# Patient Record
Sex: Male | Born: 1955 | Hispanic: Yes | Marital: Married | Smoking: Never smoker
Health system: Southern US, Community
[De-identification: ages and names within clinical notes are randomized; demographics above are authoritative.]

## PROBLEM LIST (undated history)

## (undated) DIAGNOSIS — M549 Dorsalgia, unspecified: Secondary | ICD-10-CM

## (undated) DIAGNOSIS — R109 Unspecified abdominal pain: Secondary | ICD-10-CM

## (undated) DIAGNOSIS — N399 Disorder of urinary system, unspecified: Secondary | ICD-10-CM

## (undated) HISTORY — PX: NO PAST SURGERIES: SHX2092

## (undated) HISTORY — DX: Unspecified abdominal pain: R10.9

## (undated) HISTORY — DX: Disorder of urinary system, unspecified: N39.9

## (undated) HISTORY — DX: Dorsalgia, unspecified: M54.9

---

## 2015-10-28 ENCOUNTER — Ambulatory Visit
Admission: RE | Admit: 2015-10-28 | Discharge: 2015-10-28 | Disposition: A | Discharge status: Home / Self Care | Payer: BC Managed Care – PPO | Source: Ambulatory Visit | Attending: Family Medicine | Admitting: Family Medicine

## 2015-10-28 ENCOUNTER — Other Ambulatory Visit: Payer: Self-pay | Admitting: Family Medicine

## 2015-10-28 DIAGNOSIS — R05 Cough: Secondary | ICD-10-CM | POA: Diagnosis not present

## 2015-10-28 DIAGNOSIS — R053 Chronic cough: Secondary | ICD-10-CM

## 2016-03-01 DIAGNOSIS — M810 Age-related osteoporosis without current pathological fracture: Secondary | ICD-10-CM | POA: Insufficient documentation

## 2016-06-07 ENCOUNTER — Ambulatory Visit: Payer: Self-pay | Admitting: Nurse Practitioner

## 2016-06-07 VITALS — BP 118/75 | HR 75 | Temp 98.4°F | Wt 176.0 lb

## 2016-06-07 DIAGNOSIS — E782 Mixed hyperlipidemia: Secondary | ICD-10-CM

## 2016-06-07 DIAGNOSIS — R1010 Upper abdominal pain, unspecified: Secondary | ICD-10-CM

## 2016-06-07 DIAGNOSIS — M810 Age-related osteoporosis without current pathological fracture: Secondary | ICD-10-CM

## 2016-06-07 DIAGNOSIS — R5382 Chronic fatigue, unspecified: Secondary | ICD-10-CM

## 2016-06-07 DIAGNOSIS — K219 Gastro-esophageal reflux disease without esophagitis: Secondary | ICD-10-CM

## 2016-06-07 DIAGNOSIS — R197 Diarrhea, unspecified: Secondary | ICD-10-CM

## 2016-06-07 NOTE — Progress Notes (Signed)
Moved from [place] 3 years ago, spouse is originally from [place] and pt is orginally form [place].  Pt is non-smoker and non-drinker    Is here tonight with his wife has been complaining of abdominal pain.  Predominantly epigastric area, prevents him from working.    Eats then has diarrhea within 2 hours.  For approx 3 months  Describes as a burning pain, for 2 months  Has never had a colonoscopy  In 2003, fell down steps and hurt back,  Fractured tail bone,   Later had a bonedensiometry, which showed osteoporosis Therefore is afraid to take a PPI  He does have protonix  Exam:  Alert, verbally appropriate, in NADS Conjunctiva pink No thyromegaly, no adenopathy BBrS clear + BS, tenderness with palpation of epigastric area  Palpation of liver edge with deep inspiration, no RUQ, or LLQ, RLQ tenderness  Assessment/plan:   Concern re:  Possibility of h-pylori infection, will do breath test if at all possible.  Will consider stool culture.    If infection presents will treat as indicated  Will check cmp and enzyme panels, will also do heptatitis panel.  Will assess for anemia with cbc, will consider b12 level, WILL ALSO CHECK VIT D LEVEL  Will assess other health maintenance labs as well.    Have asked pt to take his ptotinix until we can determine etiology.  Because of his  long term diarrhea, will request colonoscopy  Will bring pt back after lab results are available to review findings  Will order abd [place]

## 2016-06-07 NOTE — Progress Notes (Signed)
   Subjective:    HPI     Allergies not on file Previous Medications   No medications on file    Review of Systems  Social History  Substance Use Topics  . Smoking status: Not on file  . Smokeless tobacco: Not on file  . Alcohol use Not on file   Objective:   There were no vitals taken for this visit.  Physical Exam      Assessment & Plan:           [facility]   [facility]  This encounter was created in error - please disregard.

## 2016-06-09 ENCOUNTER — Other Ambulatory Visit: Payer: Self-pay

## 2016-06-09 DIAGNOSIS — M810 Age-related osteoporosis without current pathological fracture: Secondary | ICD-10-CM

## 2016-06-11 LAB — COMPREHENSIVE METABOLIC PANEL
ALK PHOS: 98 IU/L (ref 39–117)
ALT: 28 IU/L (ref 0–44)
AST: 24 IU/L (ref 0–40)
Albumin/Globulin Ratio: 1.8 (ref 1.2–2.2)
Albumin: 4.4 g/dL (ref 3.6–4.8)
BILIRUBIN TOTAL: 0.3 mg/dL (ref 0.0–1.2)
BUN/Creatinine Ratio: 13 (ref 10–24)
BUN: 11 mg/dL (ref 8–27)
CHLORIDE: 98 mmol/L (ref 96–106)
CO2: 26 mmol/L (ref 18–29)
Calcium: 9.1 mg/dL (ref 8.6–10.2)
Creatinine, Ser: 0.83 mg/dL (ref 0.76–1.27)
GFR calc Af Amer: 111 mL/min/{1.73_m2} (ref >59)
GFR calc non Af Amer: 96 mL/min/{1.73_m2} (ref >59)
GLUCOSE: 98 mg/dL (ref 65–99)
Globulin, Total: 2.5 g/dL (ref 1.5–4.5)
Potassium: 4.1 mmol/L (ref 3.5–5.2)
Sodium: 139 mmol/L (ref 134–144)
Total Protein: 6.9 g/dL (ref 6.0–8.5)

## 2016-06-11 LAB — H. PYLORI ANTIBODY, IGG

## 2016-06-11 LAB — CBC WITH DIFFERENTIAL/PLATELET
BASOS ABS: 0 10*3/uL (ref 0.0–0.2)
Basos: 1 %
EOS (ABSOLUTE): 0.1 10*3/uL (ref 0.0–0.4)
Eos: 3 %
HEMOGLOBIN: 13.2 g/dL (ref 13.0–17.7)
Hematocrit: 38.8 % (ref 37.5–51.0)
IMMATURE GRANS (ABS): 0 10*3/uL (ref 0.0–0.1)
Immature Granulocytes: 0 %
LYMPHS ABS: 1.5 10*3/uL (ref 0.7–3.1)
LYMPHS: 39 %
MCH: 29.7 pg (ref 26.6–33.0)
MCHC: 34 g/dL (ref 31.5–35.7)
MCV: 87 fL (ref 79–97)
MONOCYTES: 9 %
Monocytes Absolute: 0.4 10*3/uL (ref 0.1–0.9)
Neutrophils Absolute: 1.9 10*3/uL (ref 1.4–7.0)
Neutrophils: 48 %
PLATELETS: 253 10*3/uL (ref 150–379)
RBC: 4.44 x10E6/uL (ref 4.14–5.80)
RDW: 14.7 % (ref 12.3–15.4)
WBC: 3.9 10*3/uL (ref 3.4–10.8)

## 2016-06-11 LAB — AMYLASE: Amylase: 83 U/L (ref 31–124)

## 2016-06-11 LAB — HEPATITIS PANEL, ACUTE
HEP A IGM: NEGATIVE
HEP B S AG: NEGATIVE
Hep B C IgM: NEGATIVE

## 2016-06-11 LAB — LIPID PANEL
CHOL/HDL RATIO: 6.6 ratio — AB (ref 0.0–5.0)
Cholesterol, Total: 199 mg/dL (ref 100–199)
HDL: 30 mg/dL — ABNORMAL LOW (ref >39)
Triglycerides: 601 mg/dL (ref 0–149)

## 2016-06-11 LAB — VITAMIN D 25 HYDROXY (VIT D DEFICIENCY, FRACTURES): Vit D, 25-Hydroxy: 49.9 ng/mL (ref 30.0–100.0)

## 2016-06-11 LAB — VITAMIN B12: VITAMIN B 12: 426 pg/mL (ref 232–1245)

## 2016-06-11 LAB — TSH: TSH: 2.99 u[IU]/mL (ref 0.450–4.500)

## 2016-06-11 LAB — LIPASE: Lipase: 40 U/L (ref 13–78)

## 2016-06-23 ENCOUNTER — Ambulatory Visit: Payer: Self-pay | Admitting: Adult Health Nurse Practitioner

## 2016-06-23 VITALS — BP 111/71 | HR 73 | Temp 98.3°F | Wt 178.0 lb

## 2016-06-23 DIAGNOSIS — E781 Pure hyperglyceridemia: Secondary | ICD-10-CM | POA: Insufficient documentation

## 2016-06-23 DIAGNOSIS — A048 Other specified bacterial intestinal infections: Secondary | ICD-10-CM | POA: Insufficient documentation

## 2016-06-23 DIAGNOSIS — Z0000 Encounter for general adult medical examination without abnormal findings: Secondary | ICD-10-CM

## 2016-06-23 MED ORDER — PANTOPRAZOLE SODIUM 40 MG PO TBEC: 40.0000 mg | DELAYED_RELEASE_TABLET | Freq: Two times a day (BID) | ORAL | 0 refills | Status: DC

## 2016-06-23 MED ORDER — CLARITHROMYCIN 500 MG PO TABS: 250.0000 mg | ORAL_TABLET | Freq: Two times a day (BID) | ORAL | 0 refills | Status: DC

## 2016-06-23 MED ORDER — METRONIDAZOLE 500 MG PO TABS: 500.0000 mg | ORAL_TABLET | Freq: Three times a day (TID) | ORAL | 0 refills | Status: DC

## 2016-06-23 NOTE — Progress Notes (Signed)
  Patient: Jesus Lane Male    DOB: Nov 09, 1955   60 y.o.   MRN: [id number] Visit Date: 06/23/2016  Today's Provider: [facility]   Chief Complaint  Patient presents with  . Gastroesophageal Reflux  . Abdominal Pain    gas   Subjective:    HPI   H. Pylori positive.  Pt states that he is not having diarrhea when he eats.  Denies abdominal pain.  Taking protonix  Every am.     Triglycerides 601.  Unable to obtain LDL.  Not monitoring diet.     Allergies not on file Previous Medications   CALCIUM CARBONATE-VITAMIN D PO    Take 1 tablet by mouth.   PANTOPRAZOLE (PROTONIX) 40 MG TABLET    Take 40 mg by mouth.    Review of Systems  All other systems reviewed and are negative.   Social History  Substance Use Topics  . Smoking status: Never Smoker  . Smokeless tobacco: Not on file  . Alcohol use No   Objective:   BP 111/71   Pulse 73   Temp 98.3 F (36.8 C)   Wt 178 lb (80.7 kg)   Physical Exam  Constitutional: He is oriented to person, place, and time. He appears well-developed and well-nourished.  HENT:  Head: Normocephalic and atraumatic.  Eyes: Pupils are equal, round, and reactive to light.  Neck: Normal range of motion. Neck supple.  Cardiovascular: Normal rate and regular rhythm.   Pulmonary/Chest: Effort normal and breath sounds normal.  Abdominal: Soft. Bowel sounds are normal.  Neurological: He is alert and oriented to person, place, and time.  Skin: Skin is warm and dry.  Psychiatric: He has a normal mood and affect.  Vitals reviewed.       Assessment & Plan:        H. Pylori:  Clarithromycin, Flagyl and Protonix as directed.  Increase fluids.  Discussed most probable SE of GI upset- may take antibiotics with food.  Pt able to stop PPI after 14 days.  Continue calcium supplements.    Will repeat LDL direct.  If elevated- start statin.  Discussed that there may be a need for medications.   FU in 1 month for H pylori  resolution and ? Initiation of a statin drug pending LDL direct results.  All labs reviewed.   [facility]   [facility]

## 2016-06-24 LAB — LDL CHOLESTEROL, DIRECT: LDL DIRECT: 105 mg/dL — AB (ref 0–99)

## 2016-07-21 ENCOUNTER — Ambulatory Visit: Payer: Self-pay

## 2016-07-28 ENCOUNTER — Ambulatory Visit: Payer: Self-pay | Admitting: Adult Health Nurse Practitioner

## 2016-07-28 VITALS — BP 106/64 | HR 72 | Wt 174.8 lb

## 2016-07-28 DIAGNOSIS — R195 Other fecal abnormalities: Secondary | ICD-10-CM | POA: Insufficient documentation

## 2016-07-28 DIAGNOSIS — E781 Pure hyperglyceridemia: Secondary | ICD-10-CM

## 2016-07-28 DIAGNOSIS — K219 Gastro-esophageal reflux disease without esophagitis: Secondary | ICD-10-CM

## 2016-07-28 NOTE — Progress Notes (Signed)
  Patient: Jesus Lane Male    DOB: 08-May-1956   61 y.o.   MRN: [id number] Visit Date: 07/28/2016  Today's Provider: Jacelyn Pieah Doles-Johnson, NP   Chief Complaint  Patient presents with  . Gastroesophageal Reflux   Subjective:    HPI     Pt states that he feels better overall.  Was taking medications as directed and has completed the treatment for H Pylori.  Pt states that he has been having dark stools. States that is "poop is weak".  Denies hemoptysis or hematemesis.  Pt states he does not know if pain was improved while on medications.    LDL-105.  TG_over 600.  Pt states that he has modified diet at this time.    Not on File Previous Medications   CALCIUM CARBONATE-VITAMIN D PO    Take 1 tablet by mouth.   PANTOPRAZOLE (PROTONIX) 40 MG TABLET    Take 1 tablet (40 mg total) by mouth 2 (two) times daily.    Review of Systems  All other systems reviewed and are negative.   Social History  Substance Use Topics  . Smoking status: Never Smoker  . Smokeless tobacco: Not on file  . Alcohol use No   Objective:   BP 106/64 (BP Location: Left Arm, Patient Position: Sitting, Cuff Size: Large)   Pulse 72   Wt 174 lb 12.8 oz (79.3 kg)   Physical Exam  Constitutional: He is oriented to person, place, and time. He appears well-developed and well-nourished.  HENT:  Head: Normocephalic and atraumatic.  Cardiovascular: Normal rate, regular rhythm and normal heart sounds.   Pulmonary/Chest: Effort normal and breath sounds normal.  Abdominal: Soft. Bowel sounds are normal. There is tenderness.  Epigastric tenderness.   Neurological: He is alert and oriented to person, place, and time.  Skin: Skin is warm and dry.  Vitals reviewed.       Assessment & Plan:      Dark Stools:    Will order hemoccult testing.   GERD:  Highly suspect the cause of epigastric pain.  Continue Pantoprazole daily.   HLD:  Continue diet modification.  Add fish oil or flax seed oil for  Triglycerides.  If triglycerides continue to be elevated then  Consider statin.    Jacelyn Pieah Doles-Johnson, NP   [facility]

## 2016-08-01 ENCOUNTER — Ambulatory Visit: Payer: BC Managed Care – PPO | Admitting: Pharmacy Technician

## 2016-08-01 DIAGNOSIS — Z79899 Other long term (current) drug therapy: Secondary | ICD-10-CM

## 2016-08-03 NOTE — Progress Notes (Signed)
Patient speaks Spanish.  Interpretation provided by [facility].  Completed [facility] application and contract.  Patient agreed to all terms of the [facility] contract.  Patient to provide pay stubs for 2018 for spouse and 2017 tax return when available.  Zella RicherVictoria Floor, Mother of patient, listed as dependent on taxes.  Patient to provide SSI statement for [place].  Provided patient with Community education officercommunity resource material based on his particular needs.    Sherilyn DacostaBetty J. Kluttz Care Manager [facility]

## 2016-08-04 ENCOUNTER — Ambulatory Visit: Payer: Self-pay | Admitting: Ophthalmology

## 2016-08-04 LAB — FECAL OCCULT BLOOD, IMMUNOCHEMICAL: FECAL OCCULT BLD: NEGATIVE

## 2016-08-05 ENCOUNTER — Telehealth: Payer: Self-pay

## 2016-08-05 NOTE — Telephone Encounter (Signed)
Called pt with results. Pt verbalized understanding.  

## 2016-08-05 NOTE — Telephone Encounter (Signed)
-----   Message from Jacelyn Pieah Doles-Johnson, NP sent at 08/04/2016  5:49 PM EST ----- No blood found in the stool. No indication for further testing.

## 2016-08-10 ENCOUNTER — Other Ambulatory Visit: Payer: Self-pay

## 2016-08-10 DIAGNOSIS — K219 Gastro-esophageal reflux disease without esophagitis: Secondary | ICD-10-CM

## 2016-08-10 MED ORDER — PANTOPRAZOLE SODIUM 40 MG PO TBEC: 40.0000 mg | DELAYED_RELEASE_TABLET | Freq: Two times a day (BID) | ORAL | 3 refills | Status: DC

## 2016-10-14 IMAGING — CR DG CHEST 2V
1 series · 2 of 2 positions shown · non-contrast
Comparison: None.

CLINICAL DATA: Productive cough for 1 month.

EXAM:
CHEST  2 VIEW

[Series 1: w chest pa · 0.14mm/px · 2 of 2 slices shown]
[im 1/2]
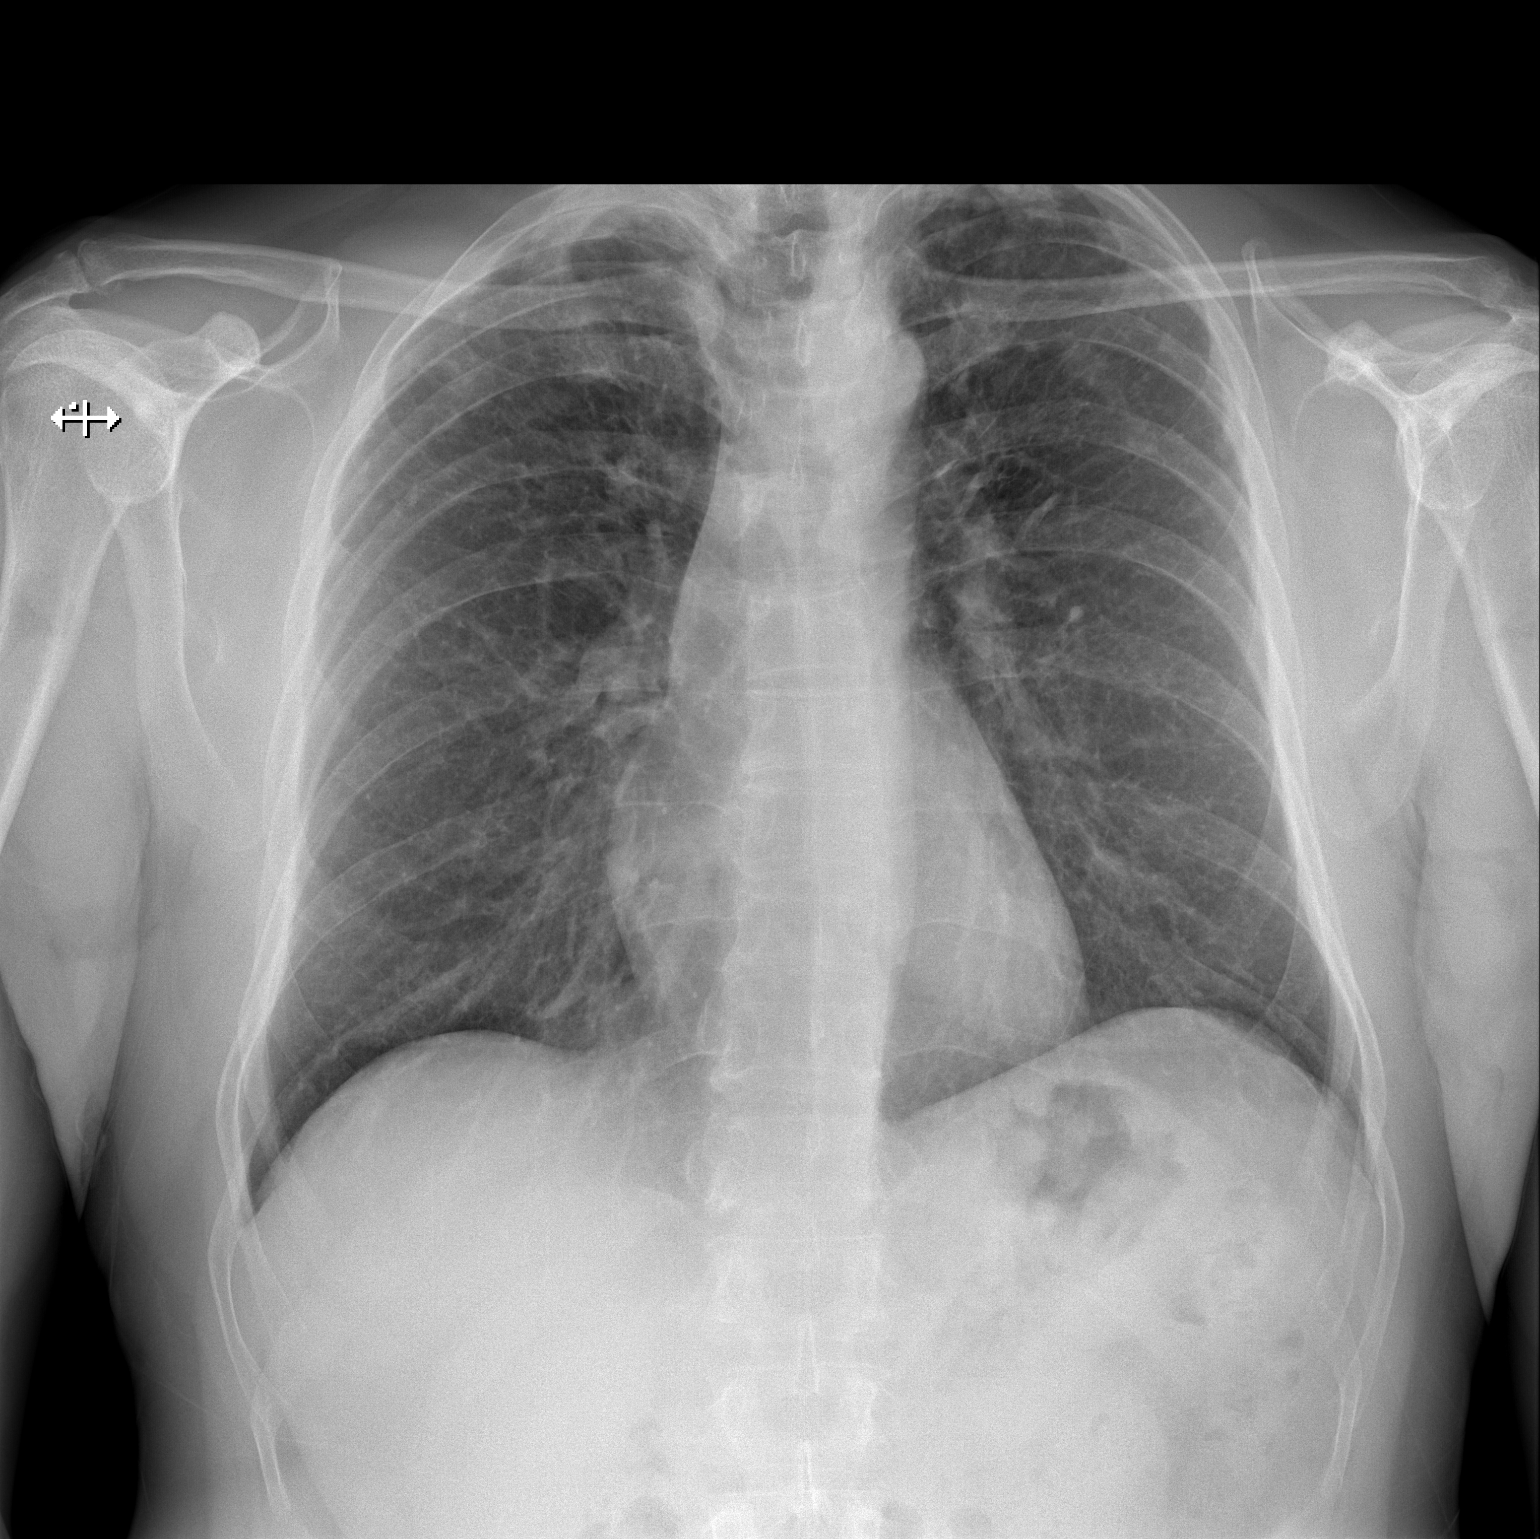
[im 2/2]
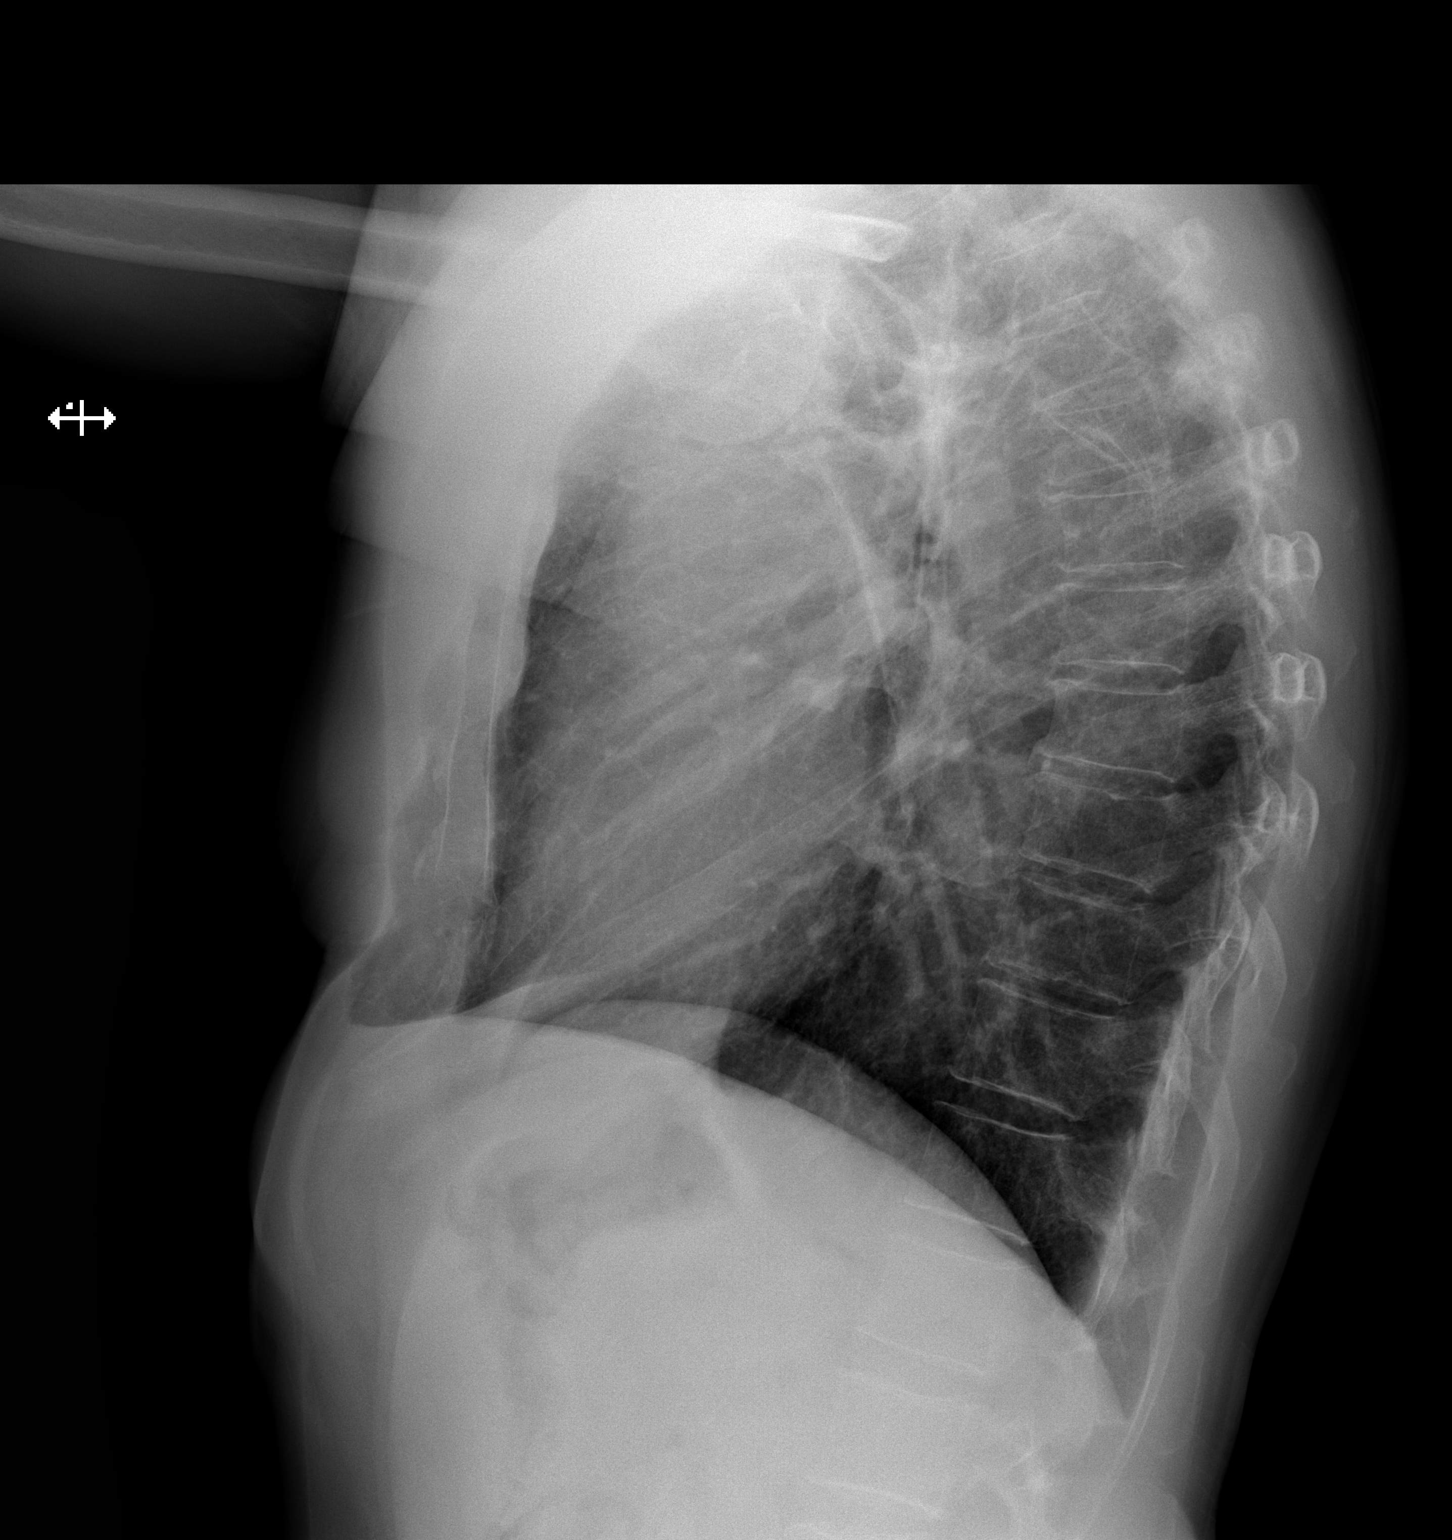

[2 of 2 positions shown; findings below may reference images not displayed]

FINDINGS: Biapical pleural/ parenchymal densities, most compatible with
scarring. No confluent airspace opacities otherwise. No effusions.
Heart is normal size. No acute bony abnormality.
IMPRESSION: Biapical pleural/ parenchymal scarring.

No active disease.

## 2016-10-25 ENCOUNTER — Ambulatory Visit: Payer: Self-pay

## 2016-10-27 ENCOUNTER — Ambulatory Visit: Payer: Self-pay

## 2016-11-03 ENCOUNTER — Ambulatory Visit: Payer: Self-pay

## 2016-11-03 ENCOUNTER — Ambulatory Visit: Payer: Self-pay | Admitting: Ophthalmology

## 2016-11-03 VITALS — BP 115/75 | HR 68

## 2016-11-03 DIAGNOSIS — I270 Primary pulmonary hypertension: Secondary | ICD-10-CM

## 2016-12-06 ENCOUNTER — Telehealth: Payer: Self-pay | Admitting: Pharmacy Technician

## 2016-12-06 NOTE — Telephone Encounter (Signed)
Patient eligible to receive medication assistance at [facility] until 08/04/17 as long as eligibility requirements continue to be met.  Betty J. Kluttz Care Manager [facility] 

## 2017-04-19 ENCOUNTER — Ambulatory Visit: Payer: Self-pay | Admitting: Internal Medicine

## 2017-04-25 ENCOUNTER — Ambulatory Visit: Payer: Self-pay | Admitting: Urology

## 2017-04-25 VITALS — BP 130/90 | HR 78 | Ht 68.0 in | Wt 164.9 lb

## 2017-04-25 DIAGNOSIS — A048 Other specified bacterial intestinal infections: Secondary | ICD-10-CM

## 2017-04-25 NOTE — Progress Notes (Signed)
  Patient: Jesus LangeOscar Reeves Male    DOB: [birth year]   61 y.o.   MRN: [id number] Visit Date: 04/25/2017  Today's Provider: Michiel CowboySHANNON Oziah Vitanza, PA-C   Chief Complaint  Patient presents with  . Results    vitamin D, cholesterol, triglycerides  . Otalgia   Subjective:    HPI Labs reviewed.  LDL elevated on 06/23/2016.  H. Pylori IgG was elevated.  Triglycerides elevated.     3 to 4 weeks ago his left ear started to hurt.  He started some drops that he received from a family member from [place].  He "Googled" and now is concerned he may have had an insect lay eggs in the ear.  He has not had any drainage from the ear.  No decreased hearing from that ear.  No rhinitis.  Mild sore throat.  No fevers or chills.  Slight dizziness.  No headache.    He is still having epigastric pain x one year.  He had not noted any triggers for the pain, like certain foods.  Tea with cinnamon helps the pain.    No Known Allergies Previous Medications   CALCIUM CARBONATE-VITAMIN D PO    Take 1 tablet by mouth.   CHOLECALCIFEROL (D3-1000) 1000 UNITS CAPSULE    Take 2,000 Units by mouth daily as needed.    Review of Systems  Constitutional: Negative.   HENT: Positive for ear pain.   Eyes: Negative.   Respiratory: Negative.   Cardiovascular: Negative.   Gastrointestinal: Positive for abdominal pain and diarrhea.  Endocrine: Negative.   Genitourinary: Negative.   Musculoskeletal: Negative.   Skin: Negative.   Allergic/Immunologic: Negative.   Neurological: Negative.   Hematological: Negative.   Psychiatric/Behavioral: Negative.     Social History  Substance Use Topics  . Smoking status: Never Smoker  . Smokeless tobacco: Never Used  . Alcohol use No   Objective:   BP 130/90 (BP Location: Left Arm, Patient Position: Sitting, Cuff Size: Normal)   Pulse 78   Ht 5\' 8"  (1.727 m)   Wt 164 lb 14.4 oz (74.8 kg)   BMI 25.07 kg/m   Physical Exam Constitutional: Well nourished. Alert and oriented, No acute  distress. HEENT: [place] AT, moist mucus membranes. Trachea midline, no masses. Cardiovascular: No clubbing, cyanosis, or edema. Respiratory: Normal respiratory effort, no increased work of breathing. GI: Abdomen is soft, non tender, non distended, no abdominal masses. Liver and spleen not palpable.  No hernias appreciated.  Stool sample for occult testing is not indicated.   GU: No CVA tenderness.  No bladder fullness or masses.   Skin: No rashes, bruises or suspicious lesions. Lymph: No cervical or inguinal adenopathy. Neurologic: Grossly intact, no focal deficits, moving all 4 extremities. Psychiatric: Normal mood and affect.      Assessment & Plan:    1. GERD  - H.pylori breath test performed today  - [facility] and did not take the ranitidine and did not follow up as scheduled  2. Ear pain  - could not evaluate ear as otoscope was not working      Michiel CowboySHANNON Ida Uppal, PA-C   [facility]

## 2017-04-25 NOTE — Progress Notes (Signed)
lipidbreath

## 2017-04-26 LAB — LIPID PANEL
CHOL/HDL RATIO: 4.8 ratio (ref 0.0–5.0)
Cholesterol, Total: 202 mg/dL — ABNORMAL HIGH (ref 100–199)
HDL: 42 mg/dL (ref >39)
LDL CALC: 118 mg/dL — AB (ref 0–99)
TRIGLYCERIDES: 208 mg/dL — AB (ref 0–149)
VLDL Cholesterol Cal: 42 mg/dL — ABNORMAL HIGH (ref 5–40)

## 2017-04-26 LAB — VITAMIN D 25 HYDROXY (VIT D DEFICIENCY, FRACTURES): VIT D 25 HYDROXY: 37.5 ng/mL (ref 30.0–100.0)

## 2017-04-27 LAB — H.PYLORI BREATH TEST (REFLEX): H. PYLORI BREATH TEST: NEGATIVE

## 2017-04-27 LAB — H. PYLORI BREATH TEST

## 2017-05-16 ENCOUNTER — Ambulatory Visit: Payer: Self-pay

## 2017-05-17 ENCOUNTER — Ambulatory Visit: Payer: Self-pay | Admitting: Internal Medicine

## 2017-05-17 VITALS — BP 119/82 | HR 76 | Temp 97.4°F | Wt 162.6 lb

## 2017-05-17 DIAGNOSIS — Z0000 Encounter for general adult medical examination without abnormal findings: Secondary | ICD-10-CM

## 2017-05-17 NOTE — Progress Notes (Signed)
   Subjective:    Patient ID: Jesus Lane, male    DOB: 12/08/1955, 61 y.o.   MRN: [id number]  HPI Patient Active Problem List   Diagnosis Date Noted  . GERD (gastroesophageal reflux disease) 07/28/2016  . Dark stools 07/28/2016  . H. pylori infection 06/23/2016  . Hypertriglyceridemia 06/23/2016  . Osteoporosis 03/01/2016   Pt here for follow up on labs. PT has had complaint of ear pain.  PT states it feels better now. He has been using alcohol and vinegar in his ear to help with the pain. Pt stated with his last blood draw he felt dizzy after he got in his car to go home. Stated that has never happened before.    Review of Systems  BP 119/82   Pulse 76   Temp (!) 97.4 F (36.3 C) (Oral)   Wt 162 lb 9.6 oz (73.8 kg)   BMI 24.72 kg/m   Allergies as of 05/17/2017   No Known Allergies     Medication List        Accurate as of 05/17/17  9:37 AM. Always use your most recent med list.          CALCIUM CARBONATE-VITAMIN D PO Take 1 tablet by mouth.   D3-1000 1000 units capsule Generic drug:  Cholecalciferol Take 2,000 Units by mouth daily as needed.          Objective:   Physical Exam  Cardiovascular: Normal rate, regular rhythm and normal heart sounds.  Pulmonary/Chest: Effort normal and breath sounds normal.  Neurological: He is alert.          Assessment & Plan:  Labs look good. Ear looks good. No signs of infection or wax build up. Explained to pt to keep using the alcohol and vinegar in his ear once a week to help prevent amy more ear problems. Vitals look good today. Dr. Candelaria Stagershaplin stated a bruise is normal it happens sometimes and that nothing went into the vein during his blood draw. Stated to stay off vitamin D and will recheck in the spring. Labs 6 months with appt 1 week later.

## 2017-05-18 ENCOUNTER — Ambulatory Visit: Payer: Self-pay | Admitting: Ophthalmology

## 2017-06-14 ENCOUNTER — Ambulatory Visit: Payer: Self-pay | Admitting: Internal Medicine

## 2017-07-19 ENCOUNTER — Encounter: Payer: Self-pay | Admitting: Internal Medicine

## 2017-07-19 ENCOUNTER — Ambulatory Visit: Payer: Self-pay | Admitting: Internal Medicine

## 2017-07-19 VITALS — BP 102/64 | HR 88 | Temp 98.0°F | Wt 160.4 lb

## 2017-07-19 DIAGNOSIS — J029 Acute pharyngitis, unspecified: Secondary | ICD-10-CM

## 2017-07-19 DIAGNOSIS — J40 Bronchitis, not specified as acute or chronic: Secondary | ICD-10-CM

## 2017-07-19 MED ORDER — GUAIFENESIN-DM 100-10 MG/5ML PO SYRP: 5.0000 mL | ORAL_SOLUTION | ORAL | 0 refills | Status: DC | PRN

## 2017-07-19 NOTE — Progress Notes (Signed)
   Subjective:    Patient ID: Jesus Lane, male    DOB: Feb 09, 1956, 62 y.o.   MRN: [id number]  HPI   Pt presents with a chief complaint of congestion, coughing, and pain in his throat.    Allergies as of 07/19/2017   No Known Allergies     Medication List        Accurate as of 07/19/17 11:18 AM. Always use your most recent med list.          CALCIUM CARBONATE-VITAMIN D PO Take 1 tablet by mouth.   D3-1000 1000 units capsule Generic drug:  Cholecalciferol Take 2,000 Units by mouth daily as needed.      Patient Active Problem List   Diagnosis Date Noted  . GERD (gastroesophageal reflux disease) 07/28/2016  . Dark stools 07/28/2016  . H. pylori infection 06/23/2016  . Hypertriglyceridemia 06/23/2016  . Osteoporosis 03/01/2016      Review of Systems     Objective:   Physical Exam  Constitutional: He is oriented to person, place, and time.  Cardiovascular: Normal rate, regular rhythm and normal heart sounds.  Pulmonary/Chest: Effort normal and breath sounds normal.  Neurological: He is alert and oriented to person, place, and time.   Throat examination shows slight eyrothema. Ear exam appears to be benign. Lungs are clear without wheezes. Has a non productive cough.   BP 102/64   Pulse 88   Temp 98 F (36.7 C)   Wt 160 lb 6.4 oz (72.8 kg)   BMI 24.39 kg/m       Assessment & Plan:    Diagnosis is pharyngitis with bronchitis.   Ordered medication (guaifenesin) to treat cough, drainage, and congestion. There doesn't appear to have an infection.  No new f/u appointment needed unless pt needs further treatment.

## 2017-09-08 ENCOUNTER — Telehealth: Payer: Self-pay | Admitting: Pharmacy Technician

## 2017-09-08 NOTE — Telephone Encounter (Signed)
Patient failed to provide all requested 2019 financial documentation.  Still need paystubs from spouse and 2018 tax return.  No additional medication assistance will be provided by [facility] without the required proof of income documentation.  Patient notified by letter.  Sherilyn DacostaBetty J. Daquane Lane Care Manager [facility]

## 2017-09-12 ENCOUNTER — Telehealth: Payer: Self-pay | Admitting: Pharmacy Technician

## 2017-09-12 NOTE — Telephone Encounter (Signed)
Received updated proof of income.  Patient eligible to receive medication assistance at [facility] through 2019, as long as eligibility requirements continue to be met.  [facility]

## 2017-11-08 ENCOUNTER — Other Ambulatory Visit: Payer: Self-pay

## 2017-11-09 ENCOUNTER — Other Ambulatory Visit: Payer: Self-pay

## 2017-11-09 DIAGNOSIS — Z0000 Encounter for general adult medical examination without abnormal findings: Secondary | ICD-10-CM

## 2017-11-11 LAB — CBC WITH DIFFERENTIAL
BASOS ABS: 0 10*3/uL (ref 0.0–0.2)
Basos: 0 %
EOS (ABSOLUTE): 0.2 10*3/uL (ref 0.0–0.4)
EOS: 4 %
HEMOGLOBIN: 13.4 g/dL (ref 13.0–17.7)
Hematocrit: 38.9 % (ref 37.5–51.0)
Immature Grans (Abs): 0 10*3/uL (ref 0.0–0.1)
Immature Granulocytes: 0 %
LYMPHS ABS: 1.8 10*3/uL (ref 0.7–3.1)
Lymphs: 39 %
MCH: 30.2 pg (ref 26.6–33.0)
MCHC: 34.4 g/dL (ref 31.5–35.7)
MCV: 88 fL (ref 79–97)
MONOCYTES: 9 %
MONOS ABS: 0.4 10*3/uL (ref 0.1–0.9)
Neutrophils Absolute: 2.2 10*3/uL (ref 1.4–7.0)
Neutrophils: 48 %
RBC: 4.43 x10E6/uL (ref 4.14–5.80)
RDW: 14.5 % (ref 12.3–15.4)
WBC: 4.6 10*3/uL (ref 3.4–10.8)

## 2017-11-11 LAB — COMPREHENSIVE METABOLIC PANEL
A/G RATIO: 1.5 (ref 1.2–2.2)
ALBUMIN: 4.4 g/dL (ref 3.6–4.8)
ALT: 12 IU/L (ref 0–44)
AST: 22 IU/L (ref 0–40)
Alkaline Phosphatase: 90 IU/L (ref 39–117)
BILIRUBIN TOTAL: 0.3 mg/dL (ref 0.0–1.2)
BUN / CREAT RATIO: 16 (ref 10–24)
BUN: 18 mg/dL (ref 8–27)
CO2: 24 mmol/L (ref 20–29)
Calcium: 8.9 mg/dL (ref 8.6–10.2)
Chloride: 102 mmol/L (ref 96–106)
Creatinine, Ser: 1.14 mg/dL (ref 0.76–1.27)
GFR calc Af Amer: 80 mL/min/{1.73_m2} (ref >59)
GFR calc non Af Amer: 69 mL/min/{1.73_m2} (ref >59)
Globulin, Total: 2.9 g/dL (ref 1.5–4.5)
Glucose: 101 mg/dL — ABNORMAL HIGH (ref 65–99)
POTASSIUM: 4 mmol/L (ref 3.5–5.2)
Sodium: 140 mmol/L (ref 134–144)
TOTAL PROTEIN: 7.3 g/dL (ref 6.0–8.5)

## 2017-11-11 LAB — LIPID PANEL
CHOLESTEROL TOTAL: 183 mg/dL (ref 100–199)
Chol/HDL Ratio: 4.4 ratio (ref 0.0–5.0)
HDL: 42 mg/dL (ref >39)
LDL Calculated: 101 mg/dL — ABNORMAL HIGH (ref 0–99)
Triglycerides: 201 mg/dL — ABNORMAL HIGH (ref 0–149)
VLDL Cholesterol Cal: 40 mg/dL (ref 5–40)

## 2017-11-11 LAB — URINALYSIS
BILIRUBIN UA: NEGATIVE
Glucose, UA: NEGATIVE
KETONES UA: NEGATIVE
Leukocytes, UA: NEGATIVE
NITRITE UA: NEGATIVE
PH UA: 6 (ref 5.0–7.5)
PROTEIN UA: NEGATIVE
RBC, UA: NEGATIVE
SPEC GRAV UA: 1.025 (ref 1.005–1.030)
Urobilinogen, Ur: 0.2 mg/dL (ref 0.2–1.0)

## 2017-11-11 LAB — TSH: TSH: 2.93 u[IU]/mL (ref 0.450–4.500)

## 2017-11-11 LAB — VITAMIN D 25 HYDROXY (VIT D DEFICIENCY, FRACTURES): VIT D 25 HYDROXY: 39.7 ng/mL (ref 30.0–100.0)

## 2017-11-15 ENCOUNTER — Ambulatory Visit: Payer: Self-pay | Admitting: Internal Medicine

## 2017-11-16 ENCOUNTER — Ambulatory Visit: Payer: Self-pay | Admitting: Ophthalmology

## 2017-11-16 ENCOUNTER — Ambulatory Visit: Payer: Self-pay

## 2017-12-05 ENCOUNTER — Ambulatory Visit: Payer: Self-pay | Admitting: Adult Health Nurse Practitioner

## 2017-12-05 VITALS — BP 113/77 | HR 63 | Temp 97.0°F | Wt 161.9 lb

## 2017-12-05 DIAGNOSIS — E781 Pure hyperglyceridemia: Secondary | ICD-10-CM

## 2017-12-05 NOTE — Progress Notes (Signed)
  Patient: Jesus LangeOscar Salinger Male    DOB: 1956-02-06   62 y.o.   MRN: [id number] Visit Date: 12/05/2017  Today's Provider: Jacelyn Pieah Doles-Johnson, NP   Chief Complaint  Patient presents with  . Follow-up    lab review   Subjective:    HPI   Denies any concern.   No Known Allergies Previous Medications   CALCIUM CARBONATE-VITAMIN D PO    Take 1 tablet by mouth.   CHOLECALCIFEROL (D3-1000) 1000 UNITS CAPSULE    Take 2,000 Units by mouth daily as needed.   GUAIFENESIN-DEXTROMETHORPHAN (ROBITUSSIN DM) 100-10 MG/5ML SYRUP    Take 5 mLs by mouth every 4 (four) hours as needed for cough.    Review of Systems  All other systems reviewed and are negative.   Social History   Tobacco Use  . Smoking status: Never Smoker  . Smokeless tobacco: Never Used  Substance Use Topics  . Alcohol use: No   Objective:   BP 113/77   Pulse 63   Temp (!) 97 F (36.1 C)   Wt 161 lb 14.4 oz (73.4 kg)   BMI 24.62 kg/m   Physical Exam  Constitutional: He appears well-developed and well-nourished.  Cardiovascular: Normal rate, regular rhythm and normal heart sounds.  Pulmonary/Chest: Effort normal and breath sounds normal.  Abdominal: Soft. Bowel sounds are normal.        Assessment & Plan:     Reviewed labs.     Continue daily vitamin D supplement.  May start Fish oil if he desires- take on daily. LDL-101   FU in 1 year.  Jacelyn Pieah Doles-Johnson, NP   [facility]

## 2018-04-12 ENCOUNTER — Ambulatory Visit: Payer: Self-pay | Admitting: Urology

## 2018-04-12 ENCOUNTER — Ambulatory Visit: Payer: Self-pay

## 2018-04-12 VITALS — BP 132/85 | HR 77 | Temp 98.1°F | Ht 66.0 in | Wt 154.9 lb

## 2018-04-12 DIAGNOSIS — M545 Low back pain, unspecified: Secondary | ICD-10-CM

## 2018-04-12 DIAGNOSIS — R3911 Hesitancy of micturition: Secondary | ICD-10-CM

## 2018-04-12 NOTE — Progress Notes (Signed)
  Patient: Jesus Lane Male    DOB: [birth year]   62 y.o.   MRN: [id number] Visit Date: 04/12/2018  Today's Provider: [facility]   Chief Complaint  Patient presents with  . Back Pain    Pt states that he fell one week ago and has had pain on the right side of his back. Worried he "might have hurt his kidney"   Subjective:    HPI Complains of right back pain for the last two months.  Nothing makes it worse.  Nothing makes it better.  He got on the Internet and fears he may have hurt his kidney.  He states he did not fall.    He is not having gross hematuria or dysuria.     No Known Allergies Previous Medications   CALCIUM CARBONATE-VITAMIN D PO    Take 1 tablet by mouth.   CHOLECALCIFEROL (D3-1000) 1000 UNITS CAPSULE    Take 2,000 Units by mouth daily as needed.   GUAIFENESIN-DEXTROMETHORPHAN (ROBITUSSIN DM) 100-10 MG/5ML SYRUP    Take 5 mLs by mouth every 4 (four) hours as needed for cough.    Review of Systems  Musculoskeletal: Positive for back pain.  All other systems reviewed and are negative.   Social History   Tobacco Use  . Smoking status: Never Smoker  . Smokeless tobacco: Never Used  Substance Use Topics  . Alcohol use: No   Objective:   BP 132/85   Pulse 77   Temp 98.1 F (36.7 C)   Ht 5\' 6"  (1.676 m)   Wt 154 lb 14.4 oz (70.3 kg)   BMI 25.00 kg/m   Physical Exam  Constitutional: He is oriented to person, place, and time. He appears well-developed and well-nourished.  Eyes: Pupils are equal, round, and reactive to light. EOM are normal.  Neck: Normal range of motion. Neck supple.  Abdominal: Soft.  Musculoskeletal: Normal range of motion.  Neurological: He is alert and oriented to person, place, and time.  Skin: Skin is warm and dry.  Psychiatric: He has a normal mood and affect. His behavior is normal. Judgment and thought content normal.        Assessment & Plan:   1. Back pain  Not likely due to kidney issues, but will check  renal function  2. Stomach pain/IBS  Appointment with dietician   3. Urinary hesitancy  Check PSA       [facility]   [facility] [place] [facility]

## 2018-04-24 ENCOUNTER — Other Ambulatory Visit: Payer: Self-pay

## 2018-04-24 DIAGNOSIS — M545 Low back pain, unspecified: Secondary | ICD-10-CM

## 2018-04-25 LAB — CBC WITH DIFFERENTIAL/PLATELET
BASOS: 1 %
Basophils Absolute: 0 10*3/uL (ref 0.0–0.2)
EOS (ABSOLUTE): 0.1 10*3/uL (ref 0.0–0.4)
EOS: 2 %
HEMATOCRIT: 38.8 % (ref 37.5–51.0)
Hemoglobin: 13 g/dL (ref 13.0–17.7)
Immature Grans (Abs): 0 10*3/uL (ref 0.0–0.1)
Immature Granulocytes: 0 %
Lymphocytes Absolute: 1.7 10*3/uL (ref 0.7–3.1)
Lymphs: 41 %
MCH: 30.2 pg (ref 26.6–33.0)
MCHC: 33.5 g/dL (ref 31.5–35.7)
MCV: 90 fL (ref 79–97)
MONOS ABS: 0.4 10*3/uL (ref 0.1–0.9)
Monocytes: 9 %
Neutrophils Absolute: 2 10*3/uL (ref 1.4–7.0)
Neutrophils: 47 %
Platelets: 263 10*3/uL (ref 150–450)
RBC: 4.3 x10E6/uL (ref 4.14–5.80)
RDW: 13.9 % (ref 12.3–15.4)
WBC: 4.3 10*3/uL (ref 3.4–10.8)

## 2018-04-25 LAB — PSA: Prostate Specific Ag, Serum: 0.6 ng/mL (ref 0.0–4.0)

## 2018-04-25 LAB — COMPREHENSIVE METABOLIC PANEL
ALBUMIN: 4.6 g/dL (ref 3.6–4.8)
ALK PHOS: 88 IU/L (ref 39–117)
ALT: 17 IU/L (ref 0–44)
AST: 20 IU/L (ref 0–40)
Albumin/Globulin Ratio: 1.8 (ref 1.2–2.2)
BUN / CREAT RATIO: 17 (ref 10–24)
BUN: 18 mg/dL (ref 8–27)
Bilirubin Total: 0.3 mg/dL (ref 0.0–1.2)
CO2: 24 mmol/L (ref 20–29)
CREATININE: 1.03 mg/dL (ref 0.76–1.27)
Calcium: 9.3 mg/dL (ref 8.6–10.2)
Chloride: 102 mmol/L (ref 96–106)
GFR calc Af Amer: 90 mL/min/{1.73_m2} (ref >59)
GFR calc non Af Amer: 77 mL/min/{1.73_m2} (ref >59)
GLOBULIN, TOTAL: 2.5 g/dL (ref 1.5–4.5)
Glucose: 106 mg/dL — ABNORMAL HIGH (ref 65–99)
Potassium: 4 mmol/L (ref 3.5–5.2)
SODIUM: 140 mmol/L (ref 134–144)
Total Protein: 7.1 g/dL (ref 6.0–8.5)

## 2018-04-25 LAB — HEMOGLOBIN A1C
Est. average glucose Bld gHb Est-mCnc: 114 mg/dL
Hgb A1c MFr Bld: 5.6 % (ref 4.8–5.6)

## 2018-04-25 LAB — LIPID PANEL
CHOLESTEROL TOTAL: 190 mg/dL (ref 100–199)
Chol/HDL Ratio: 3.8 ratio (ref 0.0–5.0)
HDL: 50 mg/dL (ref >39)
LDL CALC: 105 mg/dL — AB (ref 0–99)
TRIGLYCERIDES: 175 mg/dL — AB (ref 0–149)
VLDL Cholesterol Cal: 35 mg/dL (ref 5–40)

## 2018-05-01 ENCOUNTER — Ambulatory Visit: Payer: Self-pay | Admitting: Family Medicine

## 2018-05-01 VITALS — BP 112/71 | HR 62 | Temp 98.1°F | Ht 64.75 in | Wt 154.0 lb

## 2018-05-01 DIAGNOSIS — E781 Pure hyperglyceridemia: Secondary | ICD-10-CM

## 2018-05-01 DIAGNOSIS — Z09 Encounter for follow-up examination after completed treatment for conditions other than malignant neoplasm: Secondary | ICD-10-CM

## 2018-05-01 DIAGNOSIS — R197 Diarrhea, unspecified: Secondary | ICD-10-CM

## 2018-05-01 DIAGNOSIS — R1010 Upper abdominal pain, unspecified: Secondary | ICD-10-CM

## 2018-05-01 NOTE — Progress Notes (Signed)
Follow Up  Subjective:    Patient ID: Jesus Lane, male    DOB: 03-Dec-1955, 62 y.o.   MRN: [id number]   Chief Complaint  Patient presents with  . Follow-up    Patient is here for a follow up to discuss blood work.  Patient currently takes Calcium Carbonate and Vitamin D.      HPI  Jesus Lane is a 62 year old male with a past medical Back Pain and Urinary Problems, and Abdominal Pain. He is here today for follow up.   Current Status: Since his last office visit, he is doing well, but he continues to report c/o moderate abdominal pain. He has occasional diarrhea. He requests to be referred to GI today, but is curious about the cost.  He denies other reports of GI problems such as nausea, vomiting, and constipation. He has no reports of blood in stools, dysuria and hematuria.  He denies fevers, chills, fatigue, recent infections, weight loss, and night sweats. He has not had any headaches, visual changes, dizziness, and falls. No chest pain, heart palpitations, cough and shortness of breath reported.  No depression or anxiety reported. He denies pain today.   Past Medical History:  Diagnosis Date  . Abdominal pain   . Back pain   . Urinary problem in male     History reviewed. No pertinent family history.  Social History   Socioeconomic History  . Marital status: Married    Spouse name: Not on file  . Number of children: Not on file  . Years of education: Not on file  . Highest education level: Not on file  Occupational History  . Not on file  Social Needs  . Financial resource strain: Somewhat hard  . Food insecurity:    Worry: Sometimes true    Inability: Sometimes true  . Transportation needs:    Medical: No    Non-medical: No  Tobacco Use  . Smoking status: Never Smoker  . Smokeless tobacco: Never Used  Substance and Sexual Activity  . Alcohol use: No  . Drug use: No  . Sexual activity: Not on file  Lifestyle  . Physical activity:    Days per week: 2 days     Minutes per session: 20 min  . Stress: To some extent  Relationships  . Social connections:    Talks on phone: More than three times a week    Gets together: Never    Attends religious service: Not on file    Active member of club or organization: No    Attends meetings of clubs or organizations: Never    Relationship status: Married  . Intimate partner violence:    Fear of current or ex partner: Not on file    Emotionally abused: Not on file    Physically abused: Not on file    Forced sexual activity: Not on file  Other Topics Concern  . Not on file  Social History Narrative   [place] Spaniel could use help getting more food    History reviewed. No pertinent surgical history.   There is no immunization history on file for this patient.  Current Meds  Medication Sig  . CALCIUM CARBONATE-VITAMIN D PO Take 1 tablet by mouth.    No Known Allergies  BP 112/71 (BP Location: Left Arm)   Pulse 62   Temp 98.1 F (36.7 C)   Ht 5' 4.75" (1.645 m)   Wt 154 lb (69.9 kg)   BMI 25.83 kg/m  Review of Systems  Constitutional: Negative.   HENT: Negative.   Respiratory: Negative.   Cardiovascular: Negative.   Gastrointestinal: Positive for abdominal pain (Occasional) and diarrhea (Occasional).  Genitourinary: Negative.   Musculoskeletal: Negative.   Skin: Negative.   Neurological: Negative.   Psychiatric/Behavioral: Negative.    Objective:   Physical Exam  Constitutional: He is oriented to person, place, and time. He appears well-developed and well-nourished.  Neck: Normal range of motion. Neck supple.  Cardiovascular: Normal rate, regular rhythm, normal heart sounds and intact distal pulses.  Pulmonary/Chest: Effort normal and breath sounds normal.  Abdominal: Soft. Bowel sounds are normal.  Musculoskeletal: Normal range of motion.  Neurological: He is alert and oriented to person, place, and time.  Skin: Skin is warm and dry.  Psychiatric: He has a normal mood and affect.  His behavior is normal. Judgment and thought content normal.  Nursing note and vitals reviewed.  Assessment & Plan:   1. Pain of upper abdomen Stable today. He states that PPIs are not effective. We will refer him to GI today. Monitor.  - Ambulatory referral to Gastroenterology  2. Hypertriglyceridemia Stable. Continue low-fat diet.   3. Diarrhea, unspecified type - Ambulatory referral to Gastroenterology  4. Follow up He will follow up in 3 months.   No orders of the defined types were placed in this encounter.   Raliegh Ip,  MSN, FNP-C Patient Care [facility] [address] [place], [place] [address] [phone]

## 2018-05-02 ENCOUNTER — Encounter: Payer: Self-pay | Admitting: *Deleted

## 2018-05-07 ENCOUNTER — Encounter: Payer: Self-pay | Admitting: Family Medicine

## 2018-05-15 ENCOUNTER — Encounter: Payer: Self-pay | Admitting: Gastroenterology

## 2018-06-07 ENCOUNTER — Ambulatory Visit: Payer: BC Managed Care – PPO | Admitting: Gastroenterology

## 2018-08-02 ENCOUNTER — Ambulatory Visit: Payer: Self-pay | Admitting: Urology

## 2018-08-02 VITALS — BP 106/73 | HR 69 | Temp 98.1°F | Ht 65.5 in | Wt 153.9 lb

## 2018-08-02 DIAGNOSIS — R109 Unspecified abdominal pain: Secondary | ICD-10-CM

## 2018-08-02 DIAGNOSIS — N138 Other obstructive and reflux uropathy: Secondary | ICD-10-CM

## 2018-08-02 DIAGNOSIS — N401 Enlarged prostate with lower urinary tract symptoms: Principal | ICD-10-CM

## 2018-08-02 MED ORDER — TAMSULOSIN HCL 0.4 MG PO CAPS: 0.4000 mg | ORAL_CAPSULE | Freq: Every day | ORAL | 3 refills | Status: DC

## 2018-08-02 NOTE — Patient Instructions (Signed)
Tamsulosin capsules  What is this medicine?  TAMSULOSIN (tam SOO loe sin) is an alpha blocker. It is used to treat the signs and symptoms of an enlarged prostate in men. This condition is also called benign prostatic hyperplasia (BPH).  This medicine may be used for other purposes; ask your health care provider or pharmacist if you have questions.  COMMON BRAND NAME(S): Flomax  What should I tell my health care provider before I take this medicine?  They need to know if you have any of the following conditions:  -advanced kidney disease  -advanced liver disease  -low blood pressure  -prostate cancer  -an unusual or allergic reaction to tamsulosin, sulfa drugs, other medicines, foods, dyes, or preservatives  -pregnant or trying to get pregnant  -breast-feeding  How should I use this medicine?  Take this medicine by mouth about 30 minutes after the same meal every day. Follow the directions on the prescription label. Swallow the capsules whole with a glass of water. Do not crush, chew, or open capsules. Do not take your medicine more often than directed. Do not stop taking your medicine unless your doctor tells you to.  Talk to your pediatrician regarding the use of this medicine in children. Special care may be needed.  Overdosage: If you think you have taken too much of this medicine contact a poison control center or emergency room at once.  NOTE: This medicine is only for you. Do not share this medicine with others.  What if I miss a dose?  If you miss a dose, take it as soon as you can. If it is almost time for your next dose, take only that dose. Do not take double or extra doses. If you stop taking your medicine for several days or more, ask your doctor or health care professional what dose you should start back on.  What may interact with this medicine?  -cimetidine  -fluoxetine  -ketoconazole  -medicines for erectile disfunction like sildenafil, tadalafil, vardenafil  -medicines for high blood  pressure  -other alpha-blockers like alfuzosin, doxazosin, phentolamine, phenoxybenzamine, prazosin, terazosin  -warfarin  This list may not describe all possible interactions. Give your health care provider a list of all the medicines, herbs, non-prescription drugs, or dietary supplements you use. Also tell them if you smoke, drink alcohol, or use illegal drugs. Some items may interact with your medicine.  What should I watch for while using this medicine?  Visit your doctor or health care professional for regular check ups. You will need lab work done before you start this medicine and regularly while you are taking it. Check your blood pressure as directed. Ask your health care professional what your blood pressure should be, and when you should contact him or her.  This medicine may make you feel dizzy or lightheaded. This is more likely to happen after the first dose, after an increase in dose, or during hot weather or exercise. Drinking alcohol and taking some medicines can make this worse. Do not drive, use machinery, or do anything that needs mental alertness until you know how this medicine affects you. Do not sit or stand up quickly. If you begin to feel dizzy, sit down until you feel better. These effects can decrease once your body adjusts to the medicine.  Contact your doctor or health care professional right away if you have an erection that lasts longer than 4 hours or if it becomes painful. This may be a sign of a serious problem and   must be treated right away to prevent permanent damage.  If you are thinking of having cataract surgery, tell your eye surgeon that you have taken this medicine.  What side effects may I notice from receiving this medicine?  Side effects that you should report to your doctor or health care professional as soon as possible:  -allergic reactions like skin rash or itching, hives, swelling of the lips, mouth, tongue, or throat  -breathing problems  -change in  vision  -feeling faint or lightheaded  -irregular heartbeat  -prolonged or painful erection  -weakness  Side effects that usually do not require medical attention (report to your doctor or health care professional if they continue or are bothersome):  -back pain  -change in sex drive or performance  -constipation, nausea or vomiting  -cough  -drowsy  -runny or stuffy nose  -trouble sleeping  This list may not describe all possible side effects. Call your doctor for medical advice about side effects. You may report side effects to FDA at 1-800-FDA-1088.  Where should I keep my medicine?  Keep out of the reach of children.  Store at room temperature between 15 and 30 degrees C (59 and 86 degrees F). Throw away any unused medicine after the expiration date.  NOTE: This sheet is a summary. It may not cover all possible information. If you have questions about this medicine, talk to your doctor, pharmacist, or health care provider.   2019 Elsevier/Gold Standard (2017-11-23 12:54:06)

## 2018-08-02 NOTE — Progress Notes (Signed)
  Patient: Jesus Lane Male    DOB: Jul 27, 1955   63 y.o.   MRN: [id number] Visit Date: 08/02/2018  Today's Provider: Michiel Cowboy, PA-C   Chief Complaint  Patient presents with  . Back Pain    Low back pain on R side  . Results  . Urinary Retention   Subjective:    HPI Lower right side pain x 2 months associated with the feeling of incomplete bladder emptying  His PSA was 0.6 in 04/2018.    Major complaint(s):  Frequency and nocturia x two months. Denies any dysuria, hematuria or suprapubic pain.  Denies any recent fevers, chills, nausea or vomiting.  He does not have a family history of prostate cancer.    He has a history of STI.     No Known Allergies Previous Medications   CALCIUM CARBONATE-VITAMIN D PO    Take 1 tablet by mouth.   CHOLECALCIFEROL (D3-1000) 1000 UNITS CAPSULE    Take 2,000 Units by mouth daily as needed.   GUAIFENESIN-DEXTROMETHORPHAN (ROBITUSSIN DM) 100-10 MG/5ML SYRUP    Take 5 mLs by mouth every 4 (four) hours as needed for cough.    Review of Systems  Social History   Tobacco Use  . Smoking status: Never Smoker  . Smokeless tobacco: Never Used  Substance Use Topics  . Alcohol use: No   Objective:   BP 106/73   Pulse 69   Temp 98.1 F (36.7 C)   Ht 5' 5.5" (1.664 m)   Wt 153 lb 14.4 oz (69.8 kg)   BMI 25.22 kg/m   Physical Exam Constitutional:  Well nourished. Alert and oriented, No acute distress. HEENT: [place] AT, moist mucus membranes.  Trachea midline, no masses. Cardiovascular: No clubbing, cyanosis, or edema. Respiratory: Normal respiratory effort, no increased work of breathing. Skin: No rashes, bruises or suspicious lesions. Neurologic: Grossly intact, no focal deficits, moving all 4 extremities. Psychiatric: Normal mood and affect.      Assessment & Plan:     BPH with LUTS Continue conservative management, avoiding bladder irritants and timed voiding's Most bothersome symptoms is/are incomplete emptying  Initiate  alpha-blocker (tamsulosin 0.4 mg ), discussed side effects  RTC in one month for RUS and symptoms recheck   Right flank pain Associated with the feeling of incomplete emptying Will obtain RUS to evaluate for hydronephrosis and PVR     Michiel Cowboy, PA-C   [facility]

## 2018-08-30 ENCOUNTER — Ambulatory Visit: Payer: Self-pay

## 2018-10-11 ENCOUNTER — Ambulatory Visit: Payer: Self-pay | Admitting: Adult Health Nurse Practitioner

## 2018-10-11 DIAGNOSIS — R05 Cough: Secondary | ICD-10-CM | POA: Insufficient documentation

## 2018-10-11 DIAGNOSIS — R059 Cough, unspecified: Secondary | ICD-10-CM

## 2018-10-11 DIAGNOSIS — H00014 Hordeolum externum left upper eyelid: Secondary | ICD-10-CM | POA: Insufficient documentation

## 2018-10-11 MED ORDER — GUAIFENESIN 100 MG/5ML PO SOLN: 5.0000 mL | ORAL | 0 refills | Status: DC | PRN

## 2018-10-11 MED ORDER — BACITRACIN-POLYMYXIN B 500-10000 UNIT/GM OP OINT: 1.0000 "application " | TOPICAL_OINTMENT | Freq: Three times a day (TID) | OPHTHALMIC | 0 refills | Status: DC

## 2018-10-11 NOTE — Progress Notes (Signed)
  Patient: Jesus Lane Male    DOB: [birth year]   63 y.o.   MRN: [id number] Visit Date: 10/11/2018  Today's Provider: [facility]   No chief complaint on file.  Subjective:    HPI   Telephonic visit  Pt with a stye to the L upper eye lid x 3 weeks. States that he was putting a warm green tea bags over his eye. States that it is not reddened and slightly smaller. Vision is unaffected. No pain.     Pt states that he has been coughing x 1-2 months- non-productive.   No Known Allergies Previous Medications   CALCIUM CARBONATE-VITAMIN D PO    Take 1 tablet by mouth.   CHOLECALCIFEROL (D3-1000) 1000 UNITS CAPSULE    Take 2,000 Units by mouth daily as needed.   GUAIFENESIN-DEXTROMETHORPHAN (ROBITUSSIN DM) 100-10 MG/5ML SYRUP    Take 5 mLs by mouth every 4 (four) hours as needed for cough.   TAMSULOSIN (FLOMAX) 0.4 MG CAPS CAPSULE    Take 1 capsule (0.4 mg total) by mouth daily.    Review of Systems  Constitutional: Negative for chills and fever.  Gastrointestinal: Negative for diarrhea, nausea and vomiting.    Social History   Tobacco Use  . Smoking status: Never Smoker  . Smokeless tobacco: Never Used  Substance Use Topics  . Alcohol use: No   Objective:   There were no vitals taken for this visit.  Physical Exam  No PE    Assessment & Plan:        Bacitracin for syte TID x 7 days.  Continue warm compress- avoid tea bags on eye.  Wash with warm water and gentle soap.   Supportive care for cough.  Guaifenesin syrup.    [facility]   [facility]

## 2019-01-10 ENCOUNTER — Other Ambulatory Visit: Payer: Self-pay

## 2019-01-10 ENCOUNTER — Ambulatory Visit: Payer: Self-pay | Admitting: Urology

## 2019-01-10 VITALS — BP 115/70 | HR 79 | Temp 97.6°F | Wt 153.0 lb

## 2019-01-10 DIAGNOSIS — R109 Unspecified abdominal pain: Secondary | ICD-10-CM

## 2019-01-10 DIAGNOSIS — N138 Other obstructive and reflux uropathy: Secondary | ICD-10-CM

## 2019-01-10 MED ORDER — TAMSULOSIN HCL 0.4 MG PO CAPS: 0.4000 mg | ORAL_CAPSULE | Freq: Every day | ORAL | 3 refills | Status: DC

## 2019-01-10 NOTE — Addendum Note (Signed)
Addended by: Zara Council A on: 01/10/2019 07:56 PM   Modules accepted: Orders

## 2019-01-10 NOTE — Progress Notes (Signed)
  Patient: Jesus Lane Male    DOB: 1955/09/03   63 y.o.   MRN: [id number] Visit Date: 01/10/2019  Today's Provider: Zara Council, PA-C   Chief Complaint  Patient presents with  . Back Pain    potential kidney issue   Subjective:    HPI Jesus Lane states that he is still having right flank pain for the last several months.    He states the pain is made worse by holding his urine.  He finds some relief after voiding.  He is voiding four times during the day and two times at night.  PSA was 0.6 in 04/2018.  Creatinine 1.03.  Patient denies any gross hematuria, dysuria or suprapubic/flank pain.  Patient denies any fevers, chills, nausea or vomiting.  He states he has hesitancy and perhaps a weak stream.    No Known Allergies Previous Medications   BACITRACIN-POLYMYXIN B (POLYSPORIN) OPHTHALMIC OINTMENT    Place 1 application into the left eye 3 (three) times daily. For 7 days.   CALCIUM CARBONATE-VITAMIN D PO    Take 1 tablet by mouth.   CHOLECALCIFEROL (D3-1000) 1000 UNITS CAPSULE    Take 2,000 Units by mouth daily as needed.   GUAIFENESIN (ROBITUSSIN) 100 MG/5ML SOLN    Take 5 mLs (100 mg total) by mouth every 4 (four) hours as needed for cough or to loosen phlegm.   GUAIFENESIN-DEXTROMETHORPHAN (ROBITUSSIN DM) 100-10 MG/5ML SYRUP    Take 5 mLs by mouth every 4 (four) hours as needed for cough.   TAMSULOSIN (FLOMAX) 0.4 MG CAPS CAPSULE    Take 1 capsule (0.4 mg total) by mouth daily.    Review of Systems  Social History   Tobacco Use  . Smoking status: Never Smoker  . Smokeless tobacco: Never Used  Substance Use Topics  . Alcohol use: No   Objective:   BP 115/70   Pulse 79   Temp 97.6 F (36.4 C)   Wt 153 lb (69.4 kg)   SpO2 100%   BMI 25.07 kg/m   Physical Exam No exam     Assessment & Plan:   1. BPH  Check PSA, UA, CMP  Apply for [facility] as he will need a renal ultrasound  2. Flank pain Obtain renal ultrasound    Zara Council,  PA-C   [facility]

## 2019-01-16 LAB — COMPREHENSIVE METABOLIC PANEL
ALT: 20 IU/L (ref 0–44)
AST: 31 IU/L (ref 0–40)
Albumin/Globulin Ratio: 1.8 (ref 1.2–2.2)
Albumin: 4.5 g/dL (ref 3.8–4.8)
Alkaline Phosphatase: 81 IU/L (ref 39–117)
BUN/Creatinine Ratio: 15 (ref 10–24)
BUN: 14 mg/dL (ref 8–27)
Bilirubin Total: 0.3 mg/dL (ref 0.0–1.2)
CO2: 24 mmol/L (ref 20–29)
Calcium: 9 mg/dL (ref 8.6–10.2)
Chloride: 102 mmol/L (ref 96–106)
Creatinine, Ser: 0.95 mg/dL (ref 0.76–1.27)
GFR calc Af Amer: 99 mL/min/{1.73_m2} (ref >59)
GFR calc non Af Amer: 85 mL/min/{1.73_m2} (ref >59)
Globulin, Total: 2.5 g/dL (ref 1.5–4.5)
Glucose: 83 mg/dL (ref 65–99)
Potassium: 3.5 mmol/L (ref 3.5–5.2)
Sodium: 138 mmol/L (ref 134–144)
Total Protein: 7 g/dL (ref 6.0–8.5)

## 2019-01-16 LAB — TSH: TSH: 3.51 u[IU]/mL (ref 0.450–4.500)

## 2019-01-16 LAB — MICROSCOPIC EXAMINATION
Casts: NONE SEEN /lpf
Epithelial Cells (non renal): NONE SEEN /hpf (ref 0–10)
RBC: NONE SEEN /hpf (ref 0–2)

## 2019-01-16 LAB — URINALYSIS, COMPLETE
Bilirubin, UA: NEGATIVE
Glucose, UA: NEGATIVE
Ketones, UA: NEGATIVE
Leukocytes,UA: NEGATIVE
Nitrite, UA: NEGATIVE
Protein,UA: NEGATIVE
RBC, UA: NEGATIVE
Specific Gravity, UA: 1.01 (ref 1.005–1.030)
Urobilinogen, Ur: 0.2 mg/dL (ref 0.2–1.0)
pH, UA: 6.5 (ref 5.0–7.5)

## 2019-01-16 LAB — CBC WITH DIFFERENTIAL/PLATELET
Basophils Absolute: 0 10*3/uL (ref 0.0–0.2)
Basos: 0 %
EOS (ABSOLUTE): 0.1 10*3/uL (ref 0.0–0.4)
Eos: 3 %
Hematocrit: 39.1 % (ref 37.5–51.0)
Hemoglobin: 13.5 g/dL (ref 13.0–17.7)
Immature Grans (Abs): 0 10*3/uL (ref 0.0–0.1)
Immature Granulocytes: 0 %
Lymphocytes Absolute: 2.2 10*3/uL (ref 0.7–3.1)
Lymphs: 49 %
MCH: 30.5 pg (ref 26.6–33.0)
MCHC: 34.5 g/dL (ref 31.5–35.7)
MCV: 89 fL (ref 79–97)
Monocytes Absolute: 0.5 10*3/uL (ref 0.1–0.9)
Monocytes: 10 %
Neutrophils Absolute: 1.8 10*3/uL (ref 1.4–7.0)
Neutrophils: 38 %
Platelets: 235 10*3/uL (ref 150–450)
RBC: 4.42 x10E6/uL (ref 4.14–5.80)
RDW: 13.5 % (ref 11.6–15.4)
WBC: 4.6 10*3/uL (ref 3.4–10.8)

## 2019-01-16 LAB — VITAMIN D 1,25 DIHYDROXY
Vitamin D 1, 25 (OH)2 Total: 76 pg/mL — ABNORMAL HIGH
Vitamin D2 1, 25 (OH)2: <10 pg/mL
Vitamin D3 1, 25 (OH)2: 75 pg/mL

## 2019-01-16 LAB — HEMOGLOBIN A1C
Est. average glucose Bld gHb Est-mCnc: 117 mg/dL
Hgb A1c MFr Bld: 5.7 % — ABNORMAL HIGH (ref 4.8–5.6)

## 2019-01-16 LAB — PSA: Prostate Specific Ag, Serum: 0.6 ng/mL (ref 0.0–4.0)

## 2019-01-17 ENCOUNTER — Ambulatory Visit: Payer: Self-pay | Admitting: Gerontology

## 2019-01-17 ENCOUNTER — Ambulatory Visit: Payer: Self-pay | Admitting: Adult Health Nurse Practitioner

## 2019-01-17 ENCOUNTER — Other Ambulatory Visit: Payer: Self-pay

## 2019-01-17 DIAGNOSIS — R109 Unspecified abdominal pain: Secondary | ICD-10-CM | POA: Insufficient documentation

## 2019-01-17 DIAGNOSIS — R10A1 Flank pain, right side: Secondary | ICD-10-CM | POA: Insufficient documentation

## 2019-01-17 NOTE — Progress Notes (Signed)
  Patient: Jesus Lane Male    DOB: 1956-03-15   63 y.o.   MRN: [id number] Visit Date: 01/17/2019  Today's Provider: Staci Acosta, NP   No chief complaint on file.  Subjective:    HPI   lab review.   No Known Allergies Previous Medications   BACITRACIN-POLYMYXIN B (POLYSPORIN) OPHTHALMIC OINTMENT    Place 1 application into the left eye 3 (three) times daily. For 7 days.   CALCIUM CARBONATE-VITAMIN D PO    Take 1 tablet by mouth.   CHOLECALCIFEROL (D3-1000) 1000 UNITS CAPSULE    Take 2,000 Units by mouth daily as needed.   GUAIFENESIN (ROBITUSSIN) 100 MG/5ML SOLN    Take 5 mLs (100 mg total) by mouth every 4 (four) hours as needed for cough or to loosen phlegm.   GUAIFENESIN-DEXTROMETHORPHAN (ROBITUSSIN DM) 100-10 MG/5ML SYRUP    Take 5 mLs by mouth every 4 (four) hours as needed for cough.   TAMSULOSIN (FLOMAX) 0.4 MG CAPS CAPSULE    Take 1 capsule (0.4 mg total) by mouth daily.    Review of Systems  All other systems reviewed and are negative.   Social History   Tobacco Use  . Smoking status: Never Smoker  . Smokeless tobacco: Never Used  Substance Use Topics  . Alcohol use: No   Objective:   There were no vitals taken for this visit.  Physical Exam  No PE    Assessment & Plan:         Reviewed labs.  All stable.  Encouraged to decrease oral Vitamin D intake.   FU in 6 months for routine care.     I connected with  Jesus Lane on 01/17/19 via telephonic visit and verified that I am speaking with the correct person using two identifiers.   I discussed the limitations of evaluation and management by telemedicine. The patient expressed understanding and agreed to proceed.   Staci Acosta, NP   [facility]

## 2019-02-13 ENCOUNTER — Telehealth: Payer: Self-pay | Admitting: Pharmacy Technician

## 2019-02-13 NOTE — Telephone Encounter (Signed)
Patient failed to provide2020 financial documentation. No additional medication assistance will be provided by [facility] without the required proof of income documentation. Patient notified by letter.  Shanea Karney, CPhT [facility] 

## 2019-04-25 ENCOUNTER — Ambulatory Visit: Payer: Self-pay

## 2019-07-16 ENCOUNTER — Ambulatory Visit
Admission: RE | Admit: 2019-07-16 | Discharge: 2019-07-16 | Disposition: A | Discharge status: Home / Self Care | Payer: HRSA Program | Source: Ambulatory Visit | Attending: Family Medicine | Admitting: Family Medicine

## 2019-07-16 ENCOUNTER — Other Ambulatory Visit: Payer: Self-pay | Admitting: Family Medicine

## 2019-07-16 ENCOUNTER — Other Ambulatory Visit: Payer: Self-pay

## 2019-07-16 DIAGNOSIS — U071 COVID-19: Secondary | ICD-10-CM | POA: Diagnosis not present

## 2019-07-18 ENCOUNTER — Ambulatory Visit: Payer: Self-pay

## 2019-07-18 ENCOUNTER — Other Ambulatory Visit: Payer: Self-pay

## 2019-07-18 ENCOUNTER — Ambulatory Visit: Payer: Self-pay | Admitting: Urology

## 2019-07-18 VITALS — BP 120/75 | HR 67 | Ht 65.0 in | Wt 150.3 lb

## 2019-07-18 DIAGNOSIS — Z131 Encounter for screening for diabetes mellitus: Secondary | ICD-10-CM

## 2019-07-18 NOTE — Progress Notes (Signed)
  Patient: Jesus Lane Male    DOB: Nov 27, 1955   64 y.o.   MRN: [id number] Visit Date: 07/18/2019  Today's Provider: [facility]   Chief Complaint  Patient presents with  . Follow-up    review of chest x-ray, covid positive in 05/2019 [facility]   Subjective:    HPI  Recent chest x-ray unchanged from 2017   Was treated for finger swelling with doxycycline, but he stopped do to GI upset   He has pain in his clavicles and upper chest   No Known Allergies Previous Medications   CALCIUM CARBONATE-VITAMIN D PO    Take 1 tablet by mouth.   CHOLECALCIFEROL (D3-1000) 1000 UNITS CAPSULE    Take 2,000 Units by mouth daily as needed.   TAMSULOSIN (FLOMAX) 0.4 MG CAPS CAPSULE    Take 1 capsule (0.4 mg total) by mouth daily.    Review of Systems  Social History   Tobacco Use  . Smoking status: Never Smoker  . Smokeless tobacco: Never Used  Substance Use Topics  . Alcohol use: No   Objective:   BP 120/75   Pulse 67   Ht 5\' 5"  (1.651 m)   Wt 150 lb 4.8 oz (68.2 kg)   SpO2 98%   BMI 25.01 kg/m   Physical Exam      Assessment & Plan:       1. Costochondritis  Explained that the discomfort is likely due to coughing from COVID and encouraged patient to give it more time  2. ? Sliver in finger Encouraged patient to soak finger in Epsom salt 30 minutes twice daly    [facility]   [facility]

## 2019-07-19 LAB — HEMOGLOBIN A1C
Est. average glucose Bld gHb Est-mCnc: 111 mg/dL
Hgb A1c MFr Bld: 5.5 % (ref 4.8–5.6)

## 2019-07-19 LAB — VITAMIN D 25 HYDROXY (VIT D DEFICIENCY, FRACTURES): Vit D, 25-Hydroxy: 34.7 ng/mL (ref 30.0–100.0)

## 2019-08-01 ENCOUNTER — Ambulatory Visit: Payer: Self-pay

## 2019-11-14 ENCOUNTER — Ambulatory Visit: Payer: Self-pay

## 2019-11-28 ENCOUNTER — Other Ambulatory Visit: Payer: Self-pay

## 2019-11-28 ENCOUNTER — Ambulatory Visit: Payer: Self-pay | Admitting: Gerontology

## 2019-11-28 VITALS — BP 108/73 | HR 77 | Ht 65.0 in | Wt 146.0 lb

## 2019-11-28 DIAGNOSIS — M542 Cervicalgia: Secondary | ICD-10-CM | POA: Insufficient documentation

## 2019-11-28 DIAGNOSIS — Z0000 Encounter for general adult medical examination without abnormal findings: Secondary | ICD-10-CM

## 2019-11-28 NOTE — Progress Notes (Signed)
Established Patient Office Visit  Subjective:  Patient ID: Jesus Lane, male    DOB: 1956/06/10  Age: 64 y.o. MRN: [id number]  CC:  Chief Complaint  Patient presents with  . Back Pain  . Neck Pain    HPI Jesus Lane presents for c/o of bone problems. He states that he fell backwards walking up his steps in 2005, developed back pain and received several steroid injections. He states that his Provider  prescribed weekly Fosamax for 10 years while in [place] and he had several bone scans done. Since he moved to [place], he has not taking Fosamax. Currently, he states that he continues to experiencing intermittent non radiating dull 6/10  neck pain and back pain that started one week ago. He denies any aggravating nor relieving factor. He denies bladder or bowel incontinence and saddle anesthesia. He states that he might have developed osteoporosis and requests bone scan. Overall, he states that he's doing well and offers no further complaint.   Past Medical History:  Diagnosis Date  . Abdominal pain   . Back pain   . Urinary problem in male     No past surgical history on file.  No family history on file.  Social History   Socioeconomic History  . Marital status: Married    Spouse name: Not on file  . Number of children: Not on file  . Years of education: Not on file  . Highest education level: Not on file  Occupational History  . Not on file  Tobacco Use  . Smoking status: Never Smoker  . Smokeless tobacco: Never Used  Substance and Sexual Activity  . Alcohol use: No  . Drug use: No  . Sexual activity: Yes  Other Topics Concern  . Not on file  Social History Narrative   Michela Pitcher could use help getting more food   Social Determinants of Health   Financial Resource Strain:   . Difficulty of Paying Living Expenses:   Food Insecurity:   . Worried About Charity fundraiser in the Last Year:   . Arboriculturist in the Last Year:   Transportation Needs:   .  Film/video editor (Medical):   Marland Kitchen Lack of Transportation (Non-Medical):   Physical Activity:   . Days of Exercise per Week:   . Minutes of Exercise per Session:   Stress: No Stress Concern Present  . Feeling of Stress : Only a little  Social Connections:   . Frequency of Communication with Friends and Family:   . Frequency of Social Gatherings with Friends and Family:   . Attends Religious Services:   . Active Member of Clubs or Organizations:   . Attends Archivist Meetings:   Marland Kitchen Marital Status:   Intimate Partner Violence: Not At Risk  . Fear of Current or Ex-Partner: No  . Emotionally Abused: No  . Physically Abused: No  . Sexually Abused: No    Outpatient Medications Prior to Visit  Medication Sig Dispense Refill  . CALCIUM CARBONATE-VITAMIN D PO Take 1 tablet by mouth.    . Cholecalciferol (D3-1000) 1000 units capsule Take 2,000 Units by mouth daily as needed.    . tamsulosin (FLOMAX) 0.4 MG CAPS capsule Take 1 capsule (0.4 mg total) by mouth daily. (Patient not taking: Reported on 07/18/2019) 30 capsule 3   No facility-administered medications prior to visit.    No Known Allergies  ROS Review of Systems  Constitutional: Negative.   Respiratory: Negative.  Cardiovascular: Negative.   Musculoskeletal: Positive for back pain and neck pain.  Neurological: Negative.   Psychiatric/Behavioral: Negative.       Objective:    Physical Exam  Constitutional: He is oriented to person, place, and time. He appears well-developed.  HENT:  Head: Normocephalic and atraumatic.  Cardiovascular: Normal rate and regular rhythm.  Pulmonary/Chest: Effort normal and breath sounds normal.  Musculoskeletal:        General: Normal range of motion.  Neurological: He is alert and oriented to person, place, and time.  Psychiatric: He has a normal mood and affect. His behavior is normal. Judgment and thought content normal.    BP 108/73 (BP Location: Right Arm, Patient  Position: Sitting)   Pulse 77   Ht _0  (1.651 m)   Wt 146 lb (66.2 kg)   SpO2 98%   BMI 24.30 kg/m  Wt Readings from Last 3 Encounters:  11/28/19 146 lb (66.2 kg)  07/18/19 150 lb 4.8 oz (68.2 kg)  01/10/19 153 lb (69.4 kg)     Health Maintenance Due  Topic Date Due  . HIV Screening  Never done  . COLONOSCOPY  Never done    There are no preventive care reminders to display for this patient.  Lab Results  Component Value Date   TSH 3.510 01/10/2019   Lab Results  Component Value Date   WBC 4.6 01/10/2019   HGB 13.5 01/10/2019   HCT 39.1 01/10/2019   MCV 89 01/10/2019   PLT 235 01/10/2019   Lab Results  Component Value Date   NA 138 01/10/2019   K 3.5 01/10/2019   CO2 24 01/10/2019   GLUCOSE 83 01/10/2019   BUN 14 01/10/2019   CREATININE 0.95 01/10/2019   BILITOT 0.3 01/10/2019   ALKPHOS 81 01/10/2019   AST 31 01/10/2019   ALT 20 01/10/2019   PROT 7.0 01/10/2019   ALBUMIN 4.5 01/10/2019   CALCIUM 9.0 01/10/2019   Lab Results  Component Value Date   CHOL 190 04/24/2018   Lab Results  Component Value Date   HDL 50 04/24/2018   Lab Results  Component Value Date   LDLCALC 105 (H) 04/24/2018   Lab Results  Component Value Date   TRIG 175 (H) 04/24/2018   Lab Results  Component Value Date   CHOLHDL 3.8 04/24/2018   Lab Results  Component Value Date   HGBA1C 5.5 07/18/2019      Assessment & Plan:    1. Neck pain - He was advised to take otc 650 mg Tylenol every 12 hours for pain. Will check Calcium and advised to do some stretching exercises. - Comp Met (CMET); Future - TSH; Future  2. Health care maintenance - He was advised to complete [facility] financial application for- Ambulatory referral to Gastroenterology    Follow-up: Return in about 27 days (around 12/25/2019), or if symptoms worsen or fail to improve.    Carrieann Spielberg Jerold Coombe, NP

## 2019-12-18 ENCOUNTER — Other Ambulatory Visit: Payer: Self-pay

## 2019-12-18 DIAGNOSIS — Z0000 Encounter for general adult medical examination without abnormal findings: Secondary | ICD-10-CM

## 2019-12-18 DIAGNOSIS — M542 Cervicalgia: Secondary | ICD-10-CM

## 2019-12-19 LAB — TSH: TSH: 2.98 u[IU]/mL (ref 0.450–4.500)

## 2019-12-19 LAB — COMPREHENSIVE METABOLIC PANEL
ALT: 16 IU/L (ref 0–44)
AST: 25 IU/L (ref 0–40)
Albumin/Globulin Ratio: 1.8 (ref 1.2–2.2)
Albumin: 4.6 g/dL (ref 3.8–4.8)
Alkaline Phosphatase: 77 IU/L (ref 48–121)
BUN/Creatinine Ratio: 13 (ref 10–24)
BUN: 12 mg/dL (ref 8–27)
Bilirubin Total: 0.4 mg/dL (ref 0.0–1.2)
CO2: 25 mmol/L (ref 20–29)
Calcium: 9.1 mg/dL (ref 8.6–10.2)
Chloride: 103 mmol/L (ref 96–106)
Creatinine, Ser: 0.9 mg/dL (ref 0.76–1.27)
GFR calc Af Amer: 105 mL/min/{1.73_m2} (ref >59)
GFR calc non Af Amer: 91 mL/min/{1.73_m2} (ref >59)
Globulin, Total: 2.5 g/dL (ref 1.5–4.5)
Glucose: 95 mg/dL (ref 65–99)
Potassium: 4.3 mmol/L (ref 3.5–5.2)
Sodium: 139 mmol/L (ref 134–144)
Total Protein: 7.1 g/dL (ref 6.0–8.5)

## 2019-12-19 LAB — VITAMIN D 25 HYDROXY (VIT D DEFICIENCY, FRACTURES): Vit D, 25-Hydroxy: 34.9 ng/mL (ref 30.0–100.0)

## 2019-12-25 ENCOUNTER — Ambulatory Visit: Payer: Self-pay | Admitting: Gerontology

## 2020-01-02 ENCOUNTER — Other Ambulatory Visit: Payer: Self-pay

## 2020-01-02 ENCOUNTER — Ambulatory Visit: Payer: Self-pay | Admitting: Gerontology

## 2020-01-02 VITALS — BP 109/72 | HR 74 | Temp 98.4°F | Resp 14 | Ht 65.0 in | Wt 152.6 lb

## 2020-01-02 DIAGNOSIS — M542 Cervicalgia: Secondary | ICD-10-CM

## 2020-01-02 NOTE — Progress Notes (Signed)
Established Patient Office Visit  Subjective:  Patient ID: Jesus Lane, male    DOB: Nov 08, 1955  Age: 64 y.o. MRN: [id number]  CC:  Chief Complaint  Patient presents with  . Results    Review lab results; Pt. also has "cracking" sound in neck when he rotates head    HPI Jesus Lane presents for follow up for neck discomfort. He reports that he hears a creaking noise when he rotates his neck, he denies pain, nor paresthesia and fall. His TSH, Vitamin D and Calcium done on 12/18/2019 are within normal limit. He reports that he wants Bone scan test to rule out Osteoporosis. Overall, he states that he's doing well and offers no further.  Past Medical History:  Diagnosis Date  . Abdominal pain   . Back pain   . Urinary problem in male     History reviewed. No pertinent surgical history.  History reviewed. No pertinent family history.  Social History   Socioeconomic History  . Marital status: Married    Spouse name: Not on file  . Number of children: Not on file  . Years of education: Not on file  . Highest education level: Not on file  Occupational History  . Not on file  Tobacco Use  . Smoking status: Never Smoker  . Smokeless tobacco: Never Used  Substance and Sexual Activity  . Alcohol use: No  . Drug use: No  . Sexual activity: Yes  Other Topics Concern  . 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 Difficulty of Paying Living Expenses:   Food Insecurity:   . Worried About Programme researcher, broadcasting/film/video in the Last Year:   . Barista in the Last Year:   Transportation Needs:   . Freight forwarder (Medical):   Marland Kitchen Lack of Transportation (Non-Medical):   Physical Activity:   . Days of Exercise per Week:   . Minutes of Exercise per Session:   Stress: No Stress Concern Present  . Feeling of Stress : Only a little  Social Connections:   . Frequency of  Communication with Friends and Family:   . Frequency of Social Gatherings with Friends and Family:   . Attends Religious Services:   . Active Member of Clubs or Organizations:   . Attends Banker Meetings:   Marland Kitchen Marital Status:   Intimate Partner Violence: Not At Risk  . Fear of Current or Ex-Partner: No  . Emotionally Abused: No  . Physically Abused: No  . Sexually Abused: No    Outpatient Medications Prior to Visit  Medication Sig Dispense Refill  . CALCIUM CARBONATE-VITAMIN D PO Take 1 tablet by mouth.    . Cholecalciferol (D3-1000) 1000 units capsule Take 2,000 Units by mouth daily as needed.     No facility-administered medications prior to visit.    No Known Allergies  ROS Review of Systems  Constitutional: Negative.   Respiratory: Negative.   Cardiovascular: Negative.   Musculoskeletal: Negative.   Neurological: Negative.   Psychiatric/Behavioral: Negative.       Objective:    Physical Exam Constitutional:      Appearance: Normal appearance.  HENT:     Head: Normocephalic and atraumatic.  Cardiovascular:     Rate and Rhythm: Normal rate and regular rhythm.     Pulses: Normal pulses.     Heart sounds: Normal  heart sounds.  Pulmonary:     Effort: Pulmonary effort is normal.     Breath sounds: Normal breath sounds.  Musculoskeletal:        General: Normal range of motion.     Cervical back: Normal range of motion.  Neurological:     General: No focal deficit present.     Mental Status: He is alert and oriented to person, place, and time. Mental status is at baseline.  Psychiatric:        Mood and Affect: Mood normal.        Behavior: Behavior normal.        Thought Content: Thought content normal.        Judgment: Judgment normal.     BP 109/72 (BP Location: Left Arm, Patient Position: Sitting, Cuff Size: Normal)   Pulse 74   Temp 98.4 F (36.9 C)   Resp 14   Ht 5\' 5"  (1.651 m)   Wt 152 lb 9.6 oz (69.2 kg)   SpO2 98%   BMI 25.39  kg/m  Wt Readings from Last 3 Encounters:  01/02/20 152 lb 9.6 oz (69.2 kg)  11/28/19 146 lb (66.2 kg)  07/18/19 150 lb 4.8 oz (68.2 kg)     Health Maintenance Due  Topic Date Due  . COVID-19 Vaccine (1) Never done  . HIV Screening  Never done  . COLONOSCOPY  Never done   He reports that he will schedule Colonoscopy when his [facility] financial application is approved.   Lab Results  Component Value Date   TSH 2.980 12/18/2019   Lab Results  Component Value Date   WBC 4.6 01/10/2019   HGB 13.5 01/10/2019   HCT 39.1 01/10/2019   MCV 89 01/10/2019   PLT 235 01/10/2019   Lab Results  Component Value Date   NA 139 12/18/2019   K 4.3 12/18/2019   CO2 25 12/18/2019   GLUCOSE 95 12/18/2019   BUN 12 12/18/2019   CREATININE 0.90 12/18/2019   BILITOT 0.4 12/18/2019   ALKPHOS 77 12/18/2019   AST 25 12/18/2019   ALT 16 12/18/2019   PROT 7.1 12/18/2019   ALBUMIN 4.6 12/18/2019   CALCIUM 9.1 12/18/2019   Lab Results  Component Value Date   CHOL 190 04/24/2018   Lab Results  Component Value Date   HDL 50 04/24/2018   Lab Results  Component Value Date   LDLCALC 105 (H) 04/24/2018   Lab Results  Component Value Date   TRIG 175 (H) 04/24/2018   Lab Results  Component Value Date   CHOLHDL 3.8 04/24/2018   Lab Results  Component Value Date   HGBA1C 5.5 07/18/2019      Assessment & Plan:   1. Neck pain - He denies neck pain, was advised to exercise and perform stretches. He will follow up with Dr 07/20/2019 [facility] Orthopedic surgeon. He will continue taking his otc Calcium with Vitamin D.     Follow-up: Return in about 14 weeks (around 04/09/2020), or if symptoms worsen or fail to improve.    Raven Harmes 06/09/2020, NP

## 2020-01-07 ENCOUNTER — Telehealth: Payer: Self-pay | Admitting: Family Medicine

## 2020-01-07 NOTE — Telephone Encounter (Addendum)
Scheduled him an appt for 7/13 at 11 am  ----- Message from Enid Cutter sent at 01/02/2020  7:24 PM EDT ----- Schedule appt. With Dr. Justice Rocher when his schedule is available later in July.

## 2020-01-14 ENCOUNTER — Other Ambulatory Visit: Payer: Self-pay

## 2020-01-14 ENCOUNTER — Ambulatory Visit: Payer: Self-pay | Admitting: Specialist

## 2020-01-14 DIAGNOSIS — M542 Cervicalgia: Secondary | ICD-10-CM

## 2020-01-14 NOTE — Progress Notes (Signed)
   Subjective:    Patient ID: Jesus Lane, male    DOB: [birth year], 64 y.o.   MRN: [id number]  HPI 64 year old complains of "creaking" in his neck with ROM. There are no radicular symptoms.  He's also concerned about osteoporosis. He was told he developed it after injections in his lower back..  He works in [employer].   Review of Systems     Objective:   Physical Exam FROM cervical spine. Although he feels crepitus, as he does ROM, I do not feel anything.  FROM shoulders, elbows, wrists, and hands.  DTRs: BJ, TJ, brachioradialis 2+ and equal.  Sensation intact.  MMT 5/5 in all muscle groups.         Assessment & Plan:  I will do x-rays of his cervical spine as a baseline. I have explained bone density testing should be ordered by his PCP.   Return to clinic after obtaining neck x-ray.

## 2020-01-21 ENCOUNTER — Telehealth: Payer: Self-pay | Admitting: Family Medicine

## 2020-01-21 NOTE — Telephone Encounter (Addendum)
Said he'd be in later today or tomorrow to bring in the necessary documents  ----- Message from Benjamin Stain, CMA sent at 01/14/2020  3:20 PM EDT ----- Regarding: application Please call patient and let him know [facility] [employer] expired middle of April. He will need to reapply. Go over what he needs to bring in versus what we have in his chart. Mail him an application please. Thanks.

## 2020-02-11 ENCOUNTER — Ambulatory Visit: Payer: Self-pay | Admitting: Family Medicine

## 2020-02-11 ENCOUNTER — Other Ambulatory Visit: Payer: Self-pay

## 2020-02-11 VITALS — BP 117/80 | HR 66 | Ht 65.0 in | Wt 150.4 lb

## 2020-02-11 DIAGNOSIS — Z8739 Personal history of other diseases of the musculoskeletal system and connective tissue: Secondary | ICD-10-CM

## 2020-02-11 NOTE — Progress Notes (Signed)
Established Patient Office Visit  Subjective:  Patient ID: Jesus Lane, male    DOB: [birth year]  Age: 64 y.o. MRN: [id number]  CC:  Chief Complaint  Patient presents with  . Neck Pain    HPI Jesus Lane presents for osteoporosis.  Patient claims in [place] he was treated for osteoporosis. There have been no history to support that.  This ordered x-rays for neck pain that he thinks is osteoporosis.  He has not had those done yet.  He wants a BMD.  No other issues.  Past Medical History:  Diagnosis Date  . Abdominal pain   . Back pain   . Urinary problem in male     No past surgical history on file.  No family history on file.  Social History   Socioeconomic History  . Marital status: Married    Spouse name: Not on file  . Number of children: Not on file  . Years of education: Not on file  . Highest education level: Not on file  Occupational History  . Not on file  Tobacco Use  . Smoking status: Never Smoker  . Smokeless tobacco: Never Used  Vaping Use  . Vaping Use: Never used  Substance and Sexual Activity  . Alcohol use: No  . Drug use: No  . Sexual activity: Yes  Other Topics Concern  . 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 Difficulty of Paying Living Expenses:   Food Insecurity:   . Worried About Programme researcher, broadcasting/film/video in the Last Year:   . Barista in the Last Year:   Transportation Needs:   . Freight forwarder (Medical):   Marland Kitchen Lack of Transportation (Non-Medical):   Physical Activity:   . Days of Exercise per Week:   . Minutes of Exercise per Session:   Stress:   . Feeling of Stress :   Social Connections:   . Frequency of Communication with Friends and Family:   . Frequency of Social Gatherings with Friends and Family:   . Attends Religious Services:   . Active Member of Clubs or Organizations:   . Attends Tax inspector Meetings:   Marland Kitchen Marital Status:   Intimate Partner Violence:   . Fear of Current or Ex-Partner:   . Emotionally Abused:   Marland Kitchen Physically Abused:   . Sexually Abused:     Outpatient Medications Prior to Visit  Medication Sig Dispense Refill  . CALCIUM CARBONATE-VITAMIN D PO Take 1 tablet by mouth.    . Cholecalciferol (D3-1000) 1000 units capsule Take 2,000 Units by mouth daily as needed.     No facility-administered medications prior to visit.    No Known Allergies  ROS Review of Systems    Objective:    Physical Exam Vitals reviewed.  Constitutional:      Appearance: Normal appearance.  HENT:     Head: Normocephalic and atraumatic.  Eyes:     General: No scleral icterus.    Conjunctiva/sclera: Conjunctivae normal.  Cardiovascular:     Rate and Rhythm: Normal rate and regular rhythm.     Heart sounds: Normal heart sounds.  Pulmonary:     Effort: Pulmonary effort is normal.     Breath sounds: Normal breath sounds.  Abdominal:     Palpations: Abdomen is soft.  Musculoskeletal:  General: No tenderness or deformity.  Skin:    General: Skin is warm and dry.  Neurological:     General: No focal deficit present.     Mental Status: He is alert and oriented to person, place, and time.  Psychiatric:        Mood and Affect: Mood normal.     BP 117/80 (BP Location: Left Arm, Patient Position: Sitting)   Pulse 66   Ht 5\' 5"  (1.651 m)   Wt 150 lb 6.4 oz (68.2 kg)   BMI 25.03 kg/m  Wt Readings from Last 3 Encounters:  02/11/20 150 lb 6.4 oz (68.2 kg)  01/02/20 152 lb 9.6 oz (69.2 kg)  11/28/19 146 lb (66.2 kg)     Health Maintenance Due  Topic Date Due  . COVID-19 Vaccine (1) Never done  . HIV Screening  Never done  . COLONOSCOPY  Never done  . INFLUENZA VACCINE  02/02/2020    There are no preventive care reminders to display for this patient.  Lab Results  Component Value Date   TSH 2.980 12/18/2019   Lab Results  Component Value Date    WBC 4.6 01/10/2019   HGB 13.5 01/10/2019   HCT 39.1 01/10/2019   MCV 89 01/10/2019   PLT 235 01/10/2019   Lab Results  Component Value Date   NA 139 12/18/2019   K 4.3 12/18/2019   CO2 25 12/18/2019   GLUCOSE 95 12/18/2019   BUN 12 12/18/2019   CREATININE 0.90 12/18/2019   BILITOT 0.4 12/18/2019   ALKPHOS 77 12/18/2019   AST 25 12/18/2019   ALT 16 12/18/2019   PROT 7.1 12/18/2019   ALBUMIN 4.6 12/18/2019   CALCIUM 9.1 12/18/2019   Lab Results  Component Value Date   CHOL 190 04/24/2018   Lab Results  Component Value Date   HDL 50 04/24/2018   Lab Results  Component Value Date   LDLCALC 105 (H) 04/24/2018   Lab Results  Component Value Date   TRIG 175 (H) 04/24/2018   Lab Results  Component Value Date   CHOLHDL 3.8 04/24/2018   Lab Results  Component Value Date   HGBA1C 5.5 07/18/2019      Assessment & Plan:   Problem List Items Addressed This Visit    None    1. History of osteoporosis I am doubtful of this history.  He has x-rays pending of his neck and will follow up with the\regarding this.  Him to walk daily and take calcium and vitamin D daily.  If x-ray reveals any bone loss in his neck then BMD may be indicated. Labs including a vitamin D are normal..  No orders of the defined types were placed in this encounter.   Follow-up: No follow-ups on file.    07/20/2019, MD

## 2020-02-20 ENCOUNTER — Ambulatory Visit: Payer: Self-pay

## 2020-03-03 ENCOUNTER — Ambulatory Visit: Payer: Self-pay | Admitting: Specialist

## 2020-04-07 ENCOUNTER — Ambulatory Visit: Payer: Self-pay

## 2020-04-09 ENCOUNTER — Ambulatory Visit: Payer: Self-pay

## 2020-07-02 IMAGING — CR DG CHEST 2V
1 series · 2 of 2 positions shown · non-contrast
Comparison: 10/28/2015.

CLINICAL DATA: [id number]

EXAM:
CHEST - 2 VIEW

[Series 1: w chest pa · 0.14mm/px · 2 of 2 slices shown]
[im 1/2]
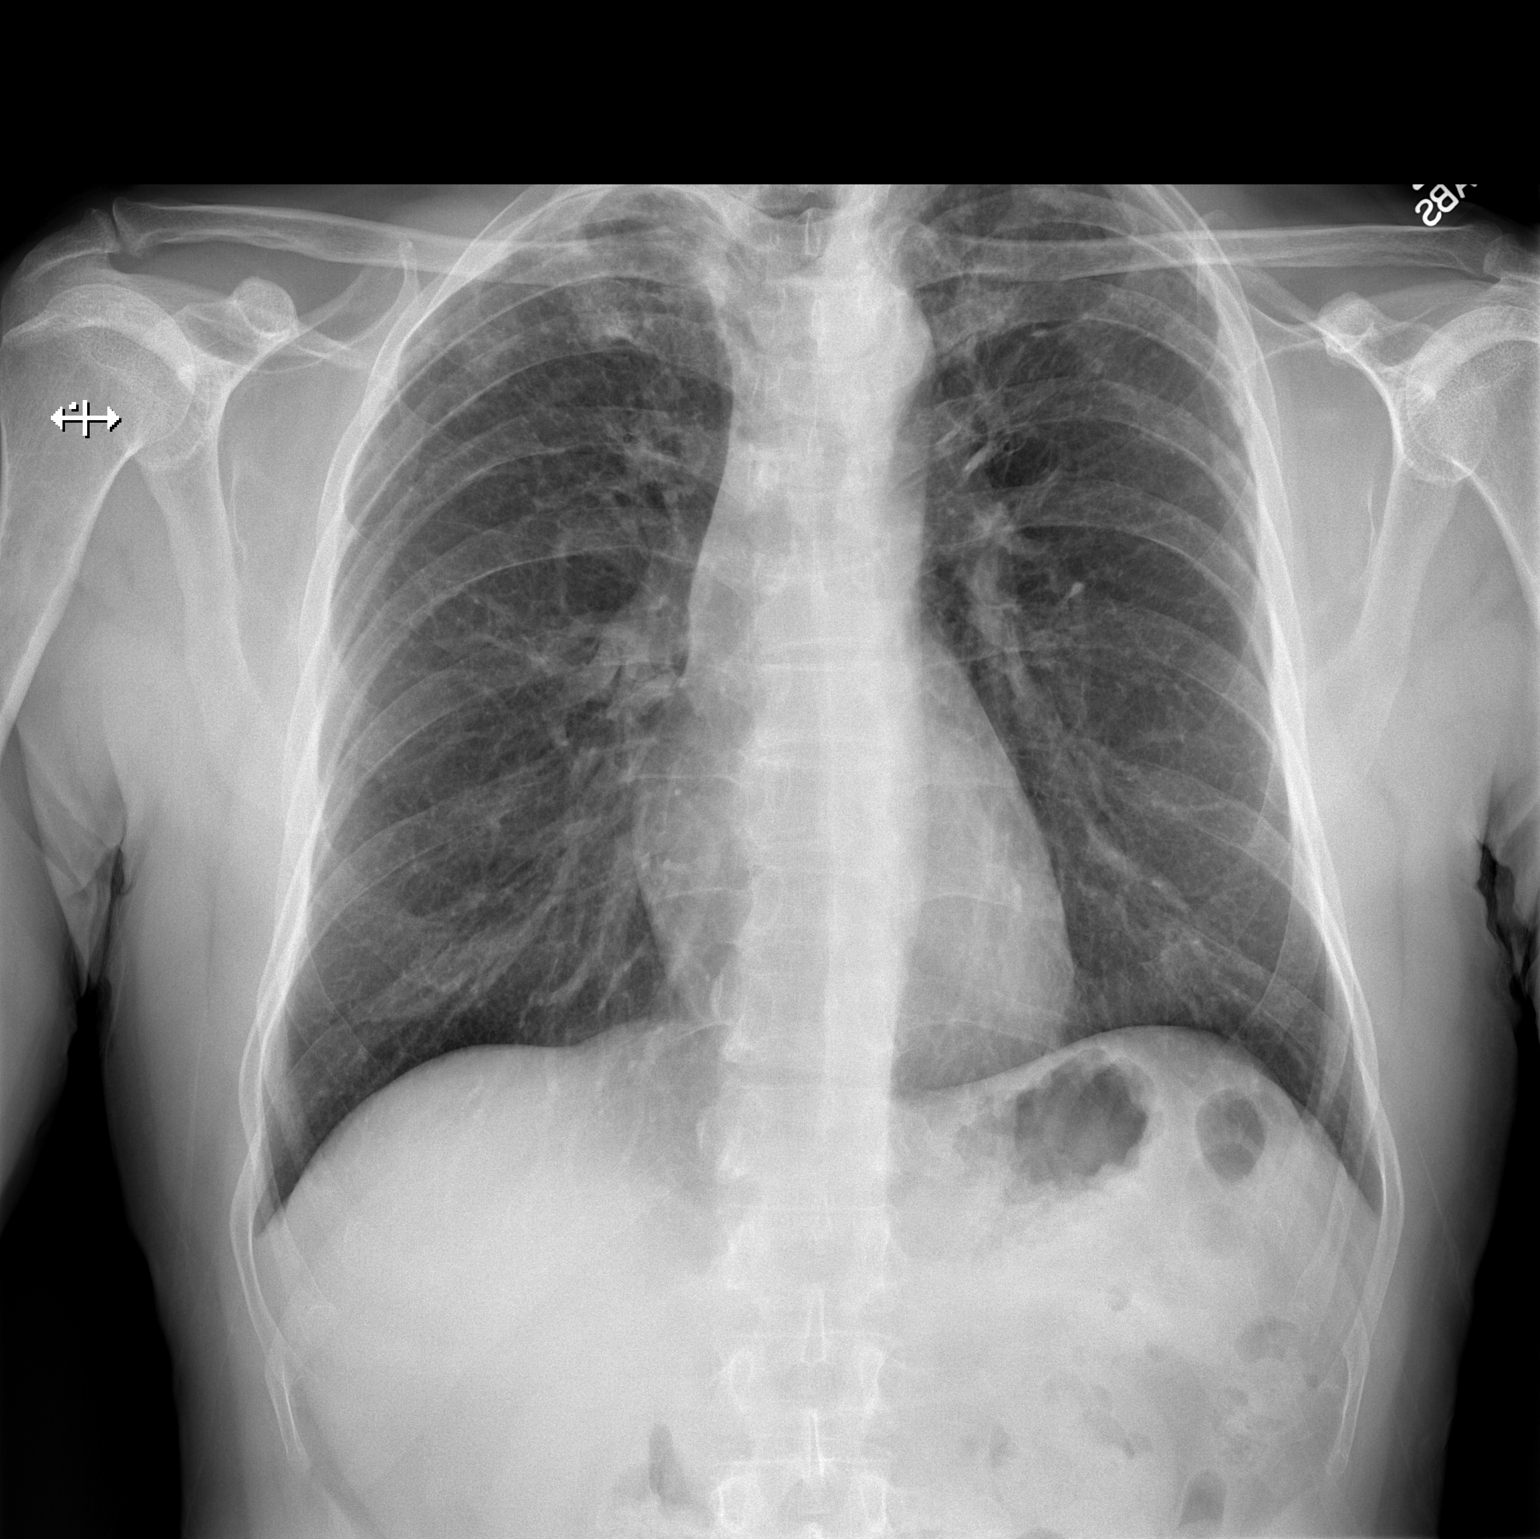
[im 2/2]
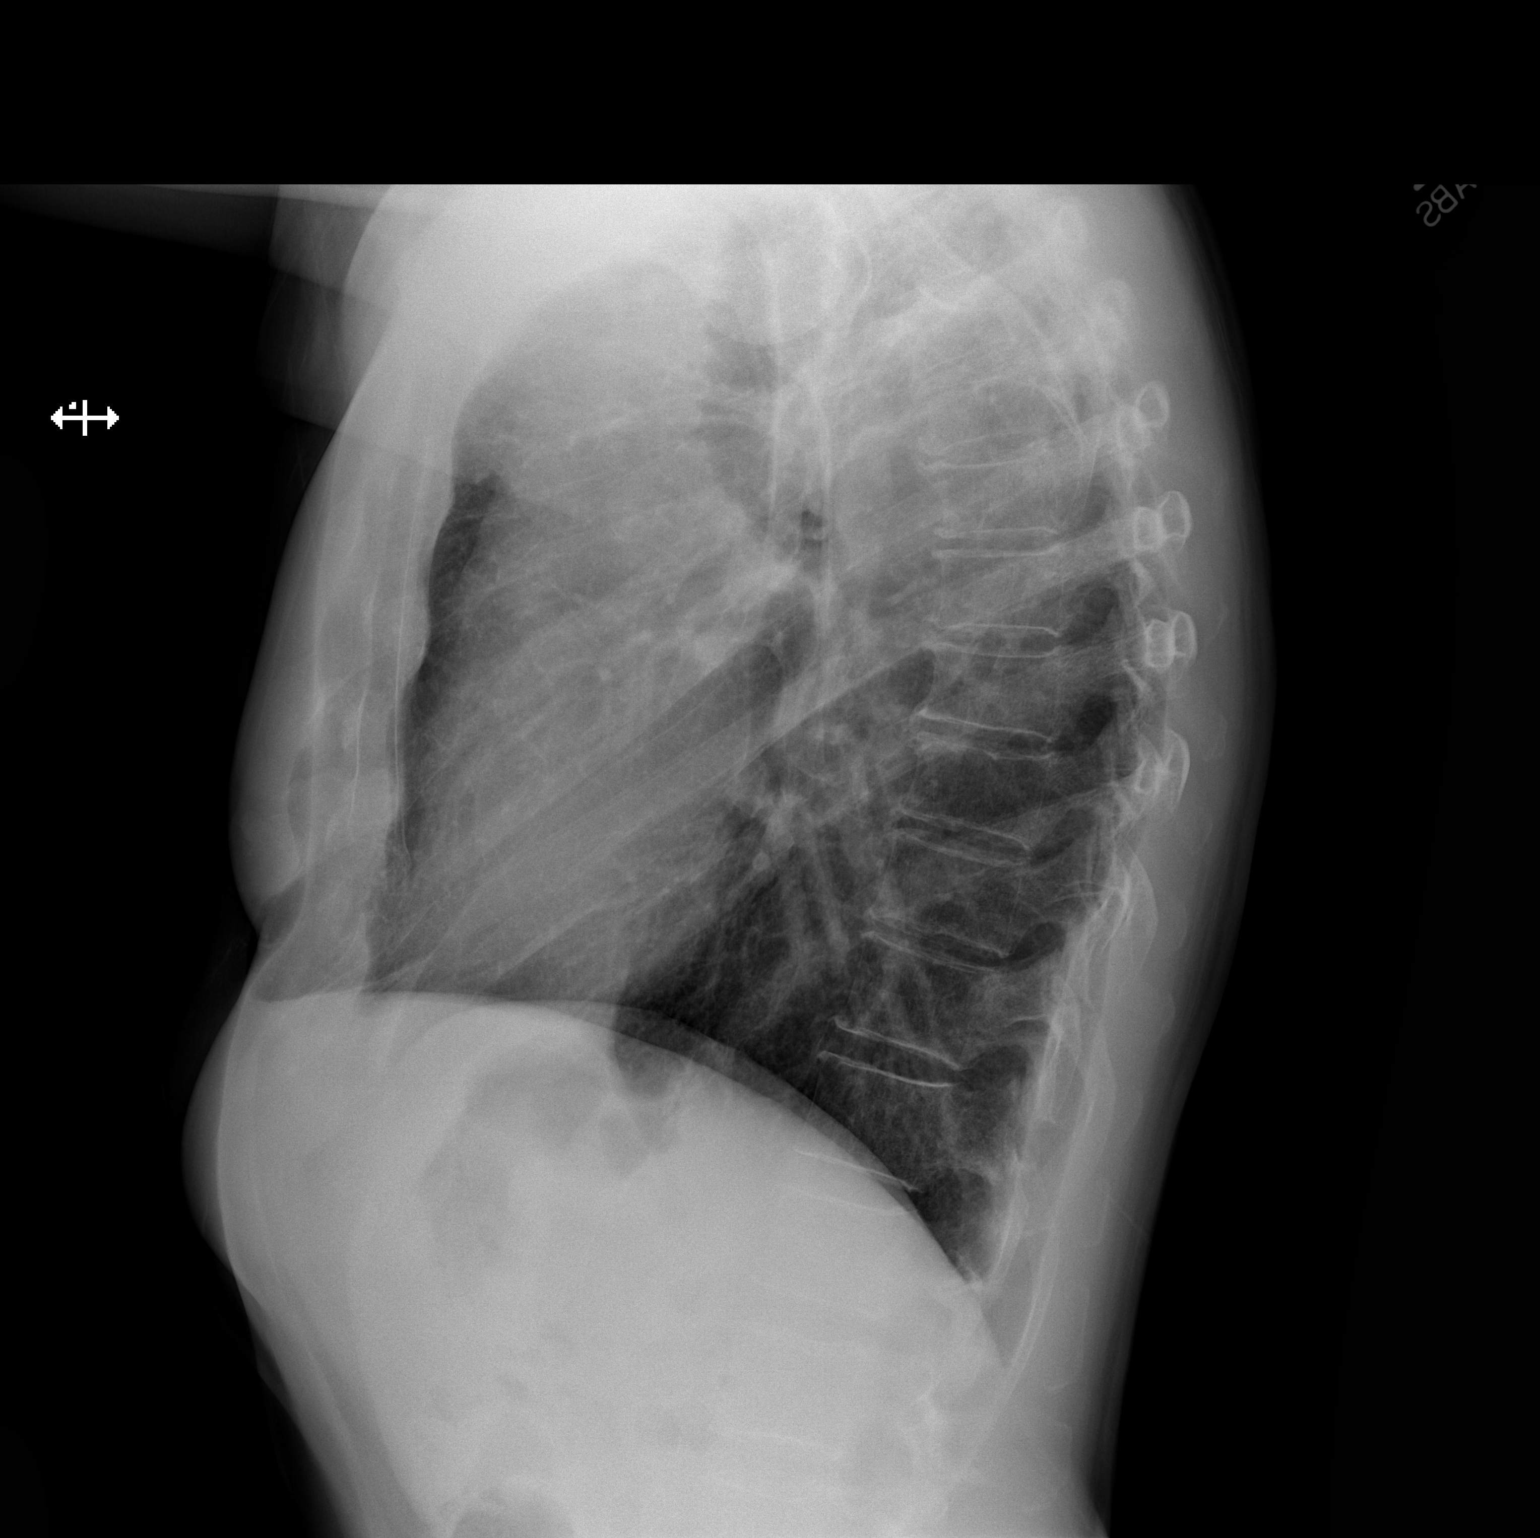

[2 of 2 positions shown; findings below may reference images not displayed]

FINDINGS: Heart size normal. Biapical pleural-parenchymal thickening again
noted consistent with scarring. No significant interim change. No
focal acute infiltrates. No pleural effusion or pneumothorax. No
acute bony abnormality.
IMPRESSION: Biapical pleural-parenchymal thickening again noted consistent
scarring. No significant interim change. No acute abnormality.

## 2020-09-17 ENCOUNTER — Ambulatory Visit: Payer: Self-pay | Admitting: Gerontology

## 2020-09-17 ENCOUNTER — Encounter: Payer: Self-pay | Admitting: Gerontology

## 2020-09-17 ENCOUNTER — Other Ambulatory Visit: Payer: Self-pay

## 2020-09-17 VITALS — BP 124/81 | HR 74 | Temp 98.2°F | Resp 16 | Wt 162.7 lb

## 2020-09-17 DIAGNOSIS — R1319 Other dysphagia: Secondary | ICD-10-CM

## 2020-09-17 NOTE — Patient Instructions (Signed)
Bradley's Neurology in Clinical Practice (8th ed., pp. 152-163). [place], [place]: Elsevier."> [employer] Textbook of Surgery (21st ed., pp. 1056-1078). [place], [place]: [employer].">  Dysphagia  Dysphagia is trouble swallowing. This condition occurs when solids and liquids stick in a person's throat on the way down to the stomach, or when food takes longer to get to the stomach than usual. You may have problems swallowing food, liquids, or both. You may also have pain while trying to swallow. It may take you more time and effort to swallow something. What are the causes? This condition may be caused by:  Muscle problems. These may make it difficult for you to move food and liquids through the esophagus, which is the tube that connects your mouth to your stomach.  Blockages. You may have ulcers, scar tissue, or inflammation that blocks the normal passage of food and liquids. Causes of these problems include: ? Acid reflux from your stomach into your esophagus (gastroesophageal reflux). ? Infections. ? Radiation treatment for cancer. ? Medicines taken without enough fluids to wash them down into your stomach.  Stroke. This can affect the nerves and make it difficult to swallow.  Nerve problems. These prevent signals from being sent to the muscles of your esophagus to squeeze (contract) and move what you swallow down to your stomach.  Globus pharyngeus. This is a common problem that involves a feeling like something is stuck in your throat or a sense of trouble with swallowing, even though nothing is wrong with the swallowing passages.  Certain conditions, such as cerebral palsy or Parkinson's disease. What are the signs or symptoms? Common symptoms of this condition include:  A feeling that solids or liquids are stuck in your throat on the way down to the stomach.  Pain while swallowing.  Coughing or gagging while trying to swallow. Other symptoms include:  Food moving back from  your stomach to your mouth (regurgitation).  Noises coming from your throat.  Chest discomfort when swallowing.  A feeling of fullness when swallowing.  Drooling, especially when the throat is blocked.  Heartburn. How is this diagnosed? This condition may be diagnosed by:  Barium swallow X-ray. In this test, you will swallow a white liquid that sticks to the inside of your esophagus. X-ray images are then taken.  Endoscopy. In this test, a flexible telescope is inserted down your throat to look at your esophagus and your stomach.  CT scans or an MRI. How is this treated? Treatment for dysphagia depends on the cause of this condition:  If the dysphagia is caused by acid reflux or infection, medicines may be used. These may include antibiotics or heartburn medicines.  If the dysphagia is caused by problems with the muscles, swallowing therapy may be used to help you strengthen your swallowing muscles. You may have to do specific exercises to strengthen the muscles or stretch them.  If the dysphagia is caused by a blockage or mass, procedures to remove the blockage may be done. You may need surgery and a feeding tube. You may need to make diet changes. Ask your health care provider for specific instructions. Follow these instructions at home: Medicines  Take over-the-counter and prescription medicines only as told by your health care provider.  If you were prescribed an antibiotic medicine, take it as told by your health care provider. Do not stop taking the antibiotic even if you start to feel better. Eating and drinking  Make any diet changes as told by your health care provider.    Work with a diet and nutrition specialist (dietitian) to create an eating plan that will help you get the nutrients you need in order to stay healthy.  Eat soft foods that are easier to swallow.  Cut your food into small pieces and eat slowly. Take small bites.  Eat and drink only when you are  sitting upright.  Do not drink alcohol or caffeine. If you need help quitting, ask your health care provider.   General instructions  Check your weight every day to make sure you are not losing weight.  Do not use any products that contain nicotine or tobacco. These products include cigarettes, chewing tobacco, and vaping devices, such as e-cigarettes. If you need help quitting, ask your health care provider.  Keep all follow-up visits. This is important. Contact a health care provider if:  You lose weight because you cannot swallow.  You cough when you drink liquids.  You cough up partially digested food. Get help right away if:  You cannot swallow your saliva.  You have shortness of breath, a fever, or both.  Your voice is hoarse and you have trouble swallowing. These symptoms may represent a serious problem that is an emergency. Do not wait to see if the symptoms will go away. Get medical help right away. Call your local emergency services (911 in the [place] Do not drive yourself to the hospital. Summary  Dysphagia is trouble swallowing. This condition occurs when solids and liquids stick in a person's throat on the way down to the stomach. You may cough or gag while trying to swallow.  Dysphagia has many possible causes.  Treatment for dysphagia depends on the cause of the condition.  Keep all follow-up visits. This is important. This information is not intended to replace advice given to you by your health care provider. Make sure you discuss any questions you have with your health care provider. Document Revised: 02/08/2020 Document Reviewed: 02/08/2020 Elsevier Patient Education  2021 Else[employer] Inc.  

## 2020-09-17 NOTE — Progress Notes (Unsigned)
.  vitr

## 2020-09-17 NOTE — Progress Notes (Signed)
Established Patient Office Visit  Subjective:  Patient ID: Jesus Lane, male    DOB: 08-31-1955  Age: 65 y.o. MRN: [id number]  CC: Difficulties swallowing  HPI Jesus Lane is a 65 year old male who presents for reports of difficulties swallowing which began after he had COVID in January. He states he feels like sometimes when he is eating or drinking, things "go down the wrong way" easier than they did before. He denies any odynophagia or hoarseness. He denies any GI upset, feelings of reflux, or hematochezia. He does not have any xerostomia. He denies any hot or cold intolerance, palpitations, weight changes, or tremors. This occurs with solids and liquids and intermittently. Otherwise, he feels well and offers no further complaints.   Past Medical History:  Diagnosis Date  . Abdominal pain   . Back pain   . Urinary problem in male     No past surgical history on file.  No family history on file.  Social History   Socioeconomic History  . Marital status: Married    Spouse name: Not on file  . Number of children: Not on file  . Years of education: Not on file  . Highest education level: Not on file  Occupational History  . Not on file  Tobacco Use  . Smoking status: Never Smoker  . Smokeless tobacco: Never Used  Vaping Use  . Vaping Use: Never used  Substance and Sexual Activity  . Alcohol use: No  . Drug use: No  . Sexual activity: Yes  Other Topics Concern  . Not on file  Social History Narrative   Michela Pitcher could use help getting more food   Social Determinants of Health   Financial Resource Strain: Not on file  Food Insecurity: Not on file  Transportation Needs: Not on file  Physical Activity: Not on file  Stress: Not on file  Social Connections: Not on file  Intimate Partner Violence: Not on file    Outpatient Medications Prior to Visit  Medication Sig Dispense Refill  . CALCIUM CARBONATE-VITAMIN D PO Take 1 tablet by mouth.    . Cholecalciferol  (D3-1000) 1000 units capsule Take 2,000 Units by mouth daily as needed.     No facility-administered medications prior to visit.    No Known Allergies  ROS Review of Systems  Constitutional: Negative.  Negative for appetite change, fatigue, fever and unexpected weight change.  HENT: Positive for trouble swallowing. Negative for sore throat and voice change.   Respiratory: Negative.  Negative for cough and shortness of breath.   Cardiovascular: Negative.  Negative for palpitations.  Gastrointestinal: Negative.  Negative for abdominal pain and blood in stool.  Endocrine: Negative.  Negative for cold intolerance and heat intolerance.  Genitourinary: Negative.   Musculoskeletal: Negative.   Skin: Negative.   Neurological: Negative.   Psychiatric/Behavioral: Negative.       Objective:    Physical Exam Constitutional:      Appearance: Normal appearance. He is normal weight. He is not toxic-appearing.  HENT:     Head: Normocephalic.     Nose: Nose normal.     Mouth/Throat:     Mouth: Mucous membranes are moist.     Pharynx: Oropharynx is clear.  Eyes:     Conjunctiva/sclera: Conjunctivae normal.     Pupils: Pupils are equal, round, and reactive to light.  Neck:     Thyroid: Thyromegaly (mild) present.     Trachea: Trachea and phonation normal.  Cardiovascular:  Rate and Rhythm: Normal rate and regular rhythm.     Pulses: Normal pulses.     Heart sounds: Normal heart sounds. No murmur heard. No friction rub. No gallop.   Pulmonary:     Effort: Pulmonary effort is normal.     Breath sounds: Normal breath sounds.  Abdominal:     General: Bowel sounds are normal. There is no distension.     Palpations: Abdomen is soft.     Tenderness: There is no abdominal tenderness.  Musculoskeletal:     Cervical back: Normal range of motion and neck supple. No tenderness.     Right lower leg: No edema.     Left lower leg: No edema.  Skin:    General: Skin is warm and dry.      Capillary Refill: Capillary refill takes less than 2 seconds.  Neurological:     Mental Status: He is alert and oriented to person, place, and time. Mental status is at baseline.  Psychiatric:        Mood and Affect: Mood normal.        Behavior: Behavior normal.     There were no vitals taken for this visit. Wt Readings from Last 3 Encounters:  02/11/20 150 lb 6.4 oz (68.2 kg)  01/02/20 152 lb 9.6 oz (69.2 kg)  11/28/19 146 lb (66.2 kg)     Health Maintenance Due  Topic Date Due  . COVID-19 Vaccine (1) Never done  . HIV Screening  Never done  . COLONOSCOPY (Pts 45-106yr Insurance coverage will need to be confirmed)  Never done  . INFLUENZA VACCINE  Never done    There are no preventive care reminders to display for this patient.  Lab Results  Component Value Date   TSH 2.980 12/18/2019   Lab Results  Component Value Date   WBC 4.6 01/10/2019   HGB 13.5 01/10/2019   HCT 39.1 01/10/2019   MCV 89 01/10/2019   PLT 235 01/10/2019   Lab Results  Component Value Date   NA 139 12/18/2019   K 4.3 12/18/2019   CO2 25 12/18/2019   GLUCOSE 95 12/18/2019   BUN 12 12/18/2019   CREATININE 0.90 12/18/2019   BILITOT 0.4 12/18/2019   ALKPHOS 77 12/18/2019   AST 25 12/18/2019   ALT 16 12/18/2019   PROT 7.1 12/18/2019   ALBUMIN 4.6 12/18/2019   CALCIUM 9.1 12/18/2019   Lab Results  Component Value Date   CHOL 190 04/24/2018   Lab Results  Component Value Date   HDL 50 04/24/2018   Lab Results  Component Value Date   LDLCALC 105 (H) 04/24/2018   Lab Results  Component Value Date   TRIG 175 (H) 04/24/2018   Lab Results  Component Value Date   CHOLHDL 3.8 04/24/2018   Lab Results  Component Value Date   HGBA1C 5.5 07/18/2019      Assessment & Plan:   1. Other dysphagia Pt does have a hx of GERD but no other red flag symptoms Due to intermittent etiology and enlargement of thyroid, UKoreathyroid and TSH ordered.  Will consider GI referral if no  abnormalities found and symptoms persist.  Pt educated on reasons to go to the ED. Avoid trigger foods, when possible.  Notify of any pain with swallowing, hematochezia, or unexplained weight loss.  - Comp Met (CMET); Future - TSH; Future - CBC; Future - UKoreaTHYROID; Future    Follow-up: Return in 1 month (10/15/20), or if symptoms do  not improve or worsen.    Clayton Bibles, RN, BSN, FNP-S

## 2020-09-18 LAB — CBC
Hematocrit: 41.8 % (ref 37.5–51.0)
Hemoglobin: 14 g/dL (ref 13.0–17.7)
MCH: 30.5 pg (ref 26.6–33.0)
MCHC: 33.5 g/dL (ref 31.5–35.7)
MCV: 91 fL (ref 79–97)
Platelets: 241 10*3/uL (ref 150–450)
RBC: 4.59 x10E6/uL (ref 4.14–5.80)
RDW: 13.5 % (ref 11.6–15.4)
WBC: 5 10*3/uL (ref 3.4–10.8)

## 2020-09-18 LAB — COMPREHENSIVE METABOLIC PANEL
ALT: 17 IU/L (ref 0–44)
AST: 28 IU/L (ref 0–40)
Albumin/Globulin Ratio: 1.6 (ref 1.2–2.2)
Albumin: 4.5 g/dL (ref 3.8–4.8)
Alkaline Phosphatase: 82 IU/L (ref 44–121)
BUN/Creatinine Ratio: 10 (ref 10–24)
BUN: 10 mg/dL (ref 8–27)
Bilirubin Total: 0.3 mg/dL (ref 0.0–1.2)
CO2: 20 mmol/L (ref 20–29)
Calcium: 9.5 mg/dL (ref 8.6–10.2)
Chloride: 101 mmol/L (ref 96–106)
Creatinine, Ser: 0.96 mg/dL (ref 0.76–1.27)
Globulin, Total: 2.8 g/dL (ref 1.5–4.5)
Glucose: 115 mg/dL — ABNORMAL HIGH (ref 65–99)
Potassium: 3.6 mmol/L (ref 3.5–5.2)
Sodium: 140 mmol/L (ref 134–144)
Total Protein: 7.3 g/dL (ref 6.0–8.5)
eGFR: 88 mL/min/{1.73_m2} (ref >59)

## 2020-09-18 LAB — TSH: TSH: 3.69 u[IU]/mL (ref 0.450–4.500)

## 2020-09-18 LAB — VITAMIN D 25 HYDROXY (VIT D DEFICIENCY, FRACTURES): Vit D, 25-Hydroxy: 31.7 ng/mL (ref 30.0–100.0)

## 2020-10-15 ENCOUNTER — Ambulatory Visit: Payer: Self-pay | Admitting: Gerontology

## 2020-10-15 ENCOUNTER — Other Ambulatory Visit: Payer: Self-pay

## 2020-10-15 VITALS — BP 106/68 | HR 76 | Temp 98.8°F | Resp 18 | Wt 161.1 lb

## 2020-10-15 DIAGNOSIS — L989 Disorder of the skin and subcutaneous tissue, unspecified: Secondary | ICD-10-CM | POA: Insufficient documentation

## 2020-10-15 MED ORDER — TRIAMCINOLONE ACETONIDE 0.1 % EX CREA
1.0000 "application " | TOPICAL_CREAM | Freq: Two times a day (BID) | CUTANEOUS | 0 refills | Status: DC
  Filled 2020-10-15: qty 30, 15d supply, fill #0

## 2020-10-15 NOTE — Progress Notes (Signed)
Established Patient Office Visit  Subjective:  Patient ID: Jesus Lane, male    DOB: 10-26-1955  Age: 65 y.o. MRN: [id number]  CC: No chief complaint on file.   HPI Jesus Lane is a 65 year old male with history of recent complaint of dysphagia who presents c/o of  gritty feeling dry pruritic skin lesion to the base of his right 4th finger. He stated that he noticed the skin lesion about 3 weeks ago. He stated that he applied moisturizer to lesion with no relief. He denies any drainage nor erythema currently, but he admits to intermittent erythema to site. He denies paresthesia, muscle or motor weakness to finger.  He states that his dysphagia  is improving and he is able to swallow without discomfort. He stated that he will go for recommended ultrasound as ordered once his charity care form is approved. Overall, he is doing well and offers no further complaints.   Past Medical History:  Diagnosis Date  . Abdominal pain   . Back pain   . Urinary problem in male     No past surgical history on file.  No family history on file.  Social History   Socioeconomic History  . Marital status: Married    Spouse name: Not on file  . Number of children: Not on file  . Years of education: Not on file  . Highest education level: Not on file  Occupational History  . Not on file  Tobacco Use  . Smoking status: Never Smoker  . Smokeless tobacco: Never Used  Vaping Use  . Vaping Use: Never used  Substance and Sexual Activity  . Alcohol use: No  . Drug use: No  . Sexual activity: Yes  Other Topics Concern  . Not on file  Social History Narrative   York Spaniel could use help getting more food   Social Determinants of Health   Financial Resource Strain: Not on file  Food Insecurity: Not on file  Transportation Needs: Not on file  Physical Activity: Not on file  Stress: Not on file  Social Connections: Not on file  Intimate Partner Violence: Not on file    Outpatient Medications  Prior to Visit  Medication Sig Dispense Refill  . CALCIUM CARBONATE-VITAMIN D PO Take 1 tablet by mouth.    . Cholecalciferol 25 MCG (1000 UT) capsule Take 2,000 Units by mouth daily as needed.     No facility-administered medications prior to visit.    No Known Allergies  ROS Review of Systems  Constitutional: Negative.   HENT: Negative.   Respiratory: Negative.   Cardiovascular: Negative.   Gastrointestinal: Negative.   Genitourinary: Negative.   Skin: Positive for rash (Right 4th finger skin lesion and dryness.).  Neurological: Negative.   Psychiatric/Behavioral: Negative.       Objective:    Physical Exam Constitutional:      Appearance: Normal appearance.  Cardiovascular:     Rate and Rhythm: Normal rate and regular rhythm.     Pulses: Normal pulses.     Heart sounds: Normal heart sounds.  Pulmonary:     Effort: Pulmonary effort is normal.     Breath sounds: Normal breath sounds.  Musculoskeletal:        General: Normal range of motion.  Skin:    General: Skin is warm.     Capillary Refill: Capillary refill takes less than 2 seconds.     Findings: Lesion present. No rash.       Neurological:  Mental Status: He is alert and oriented to person, place, and time.  Psychiatric:        Mood and Affect: Mood normal.     BP 106/68 (BP Location: Left Arm, Patient Position: Sitting, Cuff Size: Large)   Pulse 76   Temp 98.8 F (37.1 C) (Temporal)   Resp 18   Wt 161 lb 1.6 oz (73.1 kg)   SpO2 96%   BMI 26.81 kg/m  Wt Readings from Last 3 Encounters:  10/15/20 161 lb 1.6 oz (73.1 kg)  09/17/20 162 lb 11.2 oz (73.8 kg)  02/11/20 150 lb 6.4 oz (68.2 kg)     Health Maintenance Due  Topic Date Due  . COVID-19 Vaccine (1) Never done  . HIV Screening  Never done  . COLONOSCOPY (Pts 45-76yrs Insurance coverage will need to be confirmed)  Never done    There are no preventive care reminders to display for this patient.  Lab Results  Component Value Date    TSH 3.690 09/17/2020   Lab Results  Component Value Date   WBC 5.0 09/17/2020   HGB 14.0 09/17/2020   HCT 41.8 09/17/2020   MCV 91 09/17/2020   PLT 241 09/17/2020   Lab Results  Component Value Date   NA 140 09/17/2020   K 3.6 09/17/2020   CO2 20 09/17/2020   GLUCOSE 115 (H) 09/17/2020   BUN 10 09/17/2020   CREATININE 0.96 09/17/2020   BILITOT 0.3 09/17/2020   ALKPHOS 82 09/17/2020   AST 28 09/17/2020   ALT 17 09/17/2020   PROT 7.3 09/17/2020   ALBUMIN 4.5 09/17/2020   CALCIUM 9.5 09/17/2020   Lab Results  Component Value Date   CHOL 190 04/24/2018   Lab Results  Component Value Date   HDL 50 04/24/2018   Lab Results  Component Value Date   LDLCALC 105 (H) 04/24/2018   Lab Results  Component Value Date   TRIG 175 (H) 04/24/2018   Lab Results  Component Value Date   CHOLHDL 3.8 04/24/2018   Lab Results  Component Value Date   HGBA1C 5.5 07/18/2019      Assessment & Plan:   1. Skin lesion He was advised to keep affected area clean and apply cream as ordered - triamcinolone cream (KENALOG) 0.1 %; Apply 1 application topically 2 (two) times daily.  Dispense: 30 g; Refill: 0.    Follow-up: F/U 11/26/20. Go to the ED if symptoms worsens or fail to improve    Clance Boll, RN

## 2020-10-20 ENCOUNTER — Other Ambulatory Visit: Payer: Self-pay

## 2020-11-26 ENCOUNTER — Other Ambulatory Visit: Payer: Self-pay

## 2020-11-26 ENCOUNTER — Ambulatory Visit: Payer: Self-pay | Admitting: Internal Medicine

## 2020-11-26 ENCOUNTER — Encounter: Payer: Self-pay | Admitting: Gerontology

## 2020-11-26 VITALS — BP 108/76 | HR 80 | Temp 97.6°F | Resp 16 | Ht 65.0 in | Wt 159.1 lb

## 2020-11-26 DIAGNOSIS — Z0000 Encounter for general adult medical examination without abnormal findings: Secondary | ICD-10-CM

## 2020-11-26 DIAGNOSIS — R1310 Dysphagia, unspecified: Secondary | ICD-10-CM

## 2020-11-26 DIAGNOSIS — R109 Unspecified abdominal pain: Secondary | ICD-10-CM

## 2020-11-26 NOTE — Progress Notes (Signed)
Established Patient Office Visit  Subjective:  Patient ID: Jesus Lane, male    DOB: 10-Apr-1956  Age: 65 y.o. MRN: [id number]  CC: Follow up Rash.    HPI Demitrius Crass no significant PMH, presents for follow up on rash finger, hand. He report redness and rash overall has resolved. He report dry skin, thickening of skin. If he doesn't apply scream rash return. He wash his hands very frequently, he uses alcohol gel to clean hands very frequently as well.   We talk about screening colonoscopy and importance. We talk about his vitamin D level. It is normal range. He is taking vitamin D supplement.   He mention right side flank pain, happens at night. He wakes up go to bathroom and pain resolves after he urinates. He report some frequency during the day time, denies dysuria. He denies flank pain currently.   Past Medical History:  Diagnosis Date  . Abdominal pain   . Back pain   . Urinary problem in male     Past Surgical History:  Procedure Laterality Date  . NO PAST SURGERIES      Family History  Problem Relation Age of Onset  . Hypertension Mother   . Thyroid disease Mother   . Cataracts Mother   . Glaucoma Mother   . Alzheimer's disease Father     Social History   Socioeconomic History  . Marital status: Married    Spouse name: Not on file  . Number of children: Not on file  . Years of education: Not on file  . Highest education level: Not on file  Occupational History  . Not on file  Tobacco Use  . Smoking status: Never Smoker  . Smokeless tobacco: Never Used  Vaping Use  . Vaping Use: Never used  Substance and Sexual Activity  . Alcohol use: No  . Drug use: No  . Sexual activity: Yes  Other Topics Concern  . Not on file  Social History Narrative   Michela Pitcher could use help getting more food   Social Determinants of Health   Financial Resource Strain: Not on file  Food Insecurity: No Food Insecurity  . Worried About Charity fundraiser in the Last Year:  Never true  . Ran Out of Food in the Last Year: Never true  Transportation Needs: No Transportation Needs  . Lack of Transportation (Medical): No  . Lack of Transportation (Non-Medical): No  Physical Activity: Not on file  Stress: Not on file  Social Connections: Not on file  Intimate Partner Violence: Not on file    Outpatient Medications Prior to Visit  Medication Sig Dispense Refill  . CALCIUM CARBONATE-VITAMIN D PO Take 1 tablet by mouth.    . Cholecalciferol 25 MCG (1000 UT) capsule Take 2,000 Units by mouth daily as needed.    . triamcinolone cream (KENALOG) 0.1 % Apply 1 application topically 2 (two) times daily. 30 g 0   No facility-administered medications prior to visit.    No Known Allergies  ROS Review of Systems  Constitutional: Negative.   HENT: Negative.   Respiratory: Negative.   Gastrointestinal: Negative.       Objective:    Physical Exam Constitutional:      General: He is not in acute distress.    Appearance: Normal appearance.  HENT:     Head: Normocephalic.     Mouth/Throat:     Mouth: Mucous membranes are moist.     Pharynx: Oropharynx is clear. No oropharyngeal exudate.  Cardiovascular:     Rate and Rhythm: Normal rate and regular rhythm.  Pulmonary:     Effort: Pulmonary effort is normal.     Breath sounds: Normal breath sounds.  Abdominal:     General: Bowel sounds are normal. There is no distension.     Tenderness: There is no abdominal tenderness. There is no right CVA tenderness or left CVA tenderness.  Neurological:     Mental Status: He is alert.     BP 108/76 (BP Location: Right Arm, Patient Position: Sitting, Cuff Size: Normal)   Pulse 80   Temp 97.6 F (36.4 C)   Resp 16   Ht 5' 5"  (1.651 m)   Wt 159 lb 1.6 oz (72.2 kg)   SpO2 98%   BMI 26.48 kg/m  Wt Readings from Last 3 Encounters:  11/26/20 159 lb 1.6 oz (72.2 kg)  10/15/20 161 lb 1.6 oz (73.1 kg)  09/17/20 162 lb 11.2 oz (73.8 kg)     Health Maintenance Due   Topic Date Due  . COVID-19 Vaccine (1) Never done  . HIV Screening  Never done  . COLONOSCOPY (Pts 45-95yr Insurance coverage will need to be confirmed)  Never done    There are no preventive care reminders to display for this patient.  Lab Results  Component Value Date   TSH 3.690 09/17/2020   Lab Results  Component Value Date   WBC 5.0 09/17/2020   HGB 14.0 09/17/2020   HCT 41.8 09/17/2020   MCV 91 09/17/2020   PLT 241 09/17/2020   Lab Results  Component Value Date   NA 140 09/17/2020   K 3.6 09/17/2020   CO2 20 09/17/2020   GLUCOSE 115 (H) 09/17/2020   BUN 10 09/17/2020   CREATININE 0.96 09/17/2020   BILITOT 0.3 09/17/2020   ALKPHOS 82 09/17/2020   AST 28 09/17/2020   ALT 17 09/17/2020   PROT 7.3 09/17/2020   ALBUMIN 4.5 09/17/2020   CALCIUM 9.5 09/17/2020   EGFR 88 09/17/2020   Lab Results  Component Value Date   CHOL 190 04/24/2018   Lab Results  Component Value Date   HDL 50 04/24/2018   Lab Results  Component Value Date   LDLCALC 105 (H) 04/24/2018   Lab Results  Component Value Date   TRIG 175 (H) 04/24/2018   Lab Results  Component Value Date   CHOLHDL 3.8 04/24/2018   Lab Results  Component Value Date   HGBA1C 5.5 07/18/2019      Assessment & Plan:   Problem List Items Addressed This Visit    Dysphagia    Other Visit Diagnoses    Wellness examination    -  Primary   Relevant Orders   Vitamin D 1,25 dihydroxy   Lipid Profile   HIV antibody (with reflex)   Ambulatory referral to Gastroenterology   Flank pain       Relevant Orders   Urinalysis      1-Rash, Eczema: Contact Dermatitis:  This has resolved. His skin is just dry. He washes hands multiples times ( more than 10 times a day) and uses alcohol gel very frequently. I advised not to wash hand that often. To avoid using Alcohol gel. Advised to use Aveno cream and apply to hands. If redness reoccurs he could use triamcinolone again.   2-Dysphagia; This problem has  resolved. Denies weight loss.   3-Wellness Screen:  -Check Lipid panel, he has had previous elevation of triglycerides.  -Repeat Vitamin D level.  -  Check HIV for screening.  -Refer for colonoscopy.   4-Flank Pain, increase urinary frequency;  Check UA.       No orders of the defined types were placed in this encounter.   Follow-up: Return in about 3 weeks (around 12/17/2020).    Elmarie Shiley, MD

## 2020-12-03 ENCOUNTER — Other Ambulatory Visit: Payer: Self-pay

## 2020-12-03 DIAGNOSIS — R109 Unspecified abdominal pain: Secondary | ICD-10-CM

## 2020-12-03 DIAGNOSIS — Z0000 Encounter for general adult medical examination without abnormal findings: Secondary | ICD-10-CM

## 2020-12-10 ENCOUNTER — Other Ambulatory Visit: Payer: Self-pay

## 2020-12-10 ENCOUNTER — Ambulatory Visit: Payer: Self-pay | Admitting: Gerontology

## 2020-12-10 VITALS — BP 130/87 | Temp 99.6°F | Ht 65.75 in | Wt 159.3 lb

## 2020-12-10 DIAGNOSIS — E785 Hyperlipidemia, unspecified: Secondary | ICD-10-CM

## 2020-12-10 DIAGNOSIS — Z0000 Encounter for general adult medical examination without abnormal findings: Secondary | ICD-10-CM

## 2020-12-10 NOTE — Progress Notes (Signed)
Established Patient Office Visit  Subjective:  Patient ID: Jesus Lane, male    DOB: April 13, 1956  Age: 65 y.o. MRN: [id number]  CC:  Chief Complaint  Patient presents with   Back Pain    Right sided back pain     HPI Jesus Lane is a 65 y/o male with PMH of Abdominal pain, back pain, presents for lab review. His LDL was 132 mg/dl, Total cholesterol was 217 mg/dl and Triglycerides was 226 mg/dl. His Vit D was not released. He had a fall walking up the steps in 2003 and has been experiencing intermittent  non radiating 3/10 pain to his lower back. He denies saddle anesthesia, bowel or bladder incontinence. He reports that taking 200 mg of Motrin relieves pain. He denies back pain during visit. He received his first dose of Covid vaccine and denies any side effects. Overall, he states that he's doing well and offers no further complaint.  Past Medical History:  Diagnosis Date   Abdominal pain    Back pain    Urinary problem in male     Past Surgical History:  Procedure Laterality Date   NO PAST SURGERIES      Family History  Problem Relation Age of Onset   Hypertension Mother    Thyroid disease Mother    Cataracts Mother    Glaucoma Mother    Alzheimer's disease Father     Social History   Socioeconomic History   Marital status: Married    Spouse name: Not on file   Number of children: Not on file   Years of education: Not on file   Highest education level: Not on file  Occupational History   Not on file  Tobacco Use   Smoking status: Never   Smokeless tobacco: Never  Vaping Use   Vaping Use: Never used  Substance and Sexual Activity   Alcohol use: No   Drug use: No   Sexual activity: Yes  Other Topics Concern   Not on file  Social History Narrative   Jesus Lane could use help getting more food   Social Determinants of Health   Financial Resource Strain: Not on file  Food Insecurity: Food Insecurity Present   Worried About [place] in the Last  Year: Sometimes true   Ran Out of Food in the Last Year: Sometimes true  Transportation Needs: No Transportation Needs   Lack of Transportation (Medical): No   Lack of Transportation (Non-Medical): No  Physical Activity: Not on file  Stress: Not on file  Social Connections: Not on file  Intimate Partner Violence: Not on file    Outpatient Medications Prior to Visit  Medication Sig Dispense Refill   CALCIUM CARBONATE-VITAMIN D PO Take 1 tablet by mouth.     Cholecalciferol 25 MCG (1000 UT) capsule Take 2,000 Units by mouth daily as needed.     triamcinolone cream (KENALOG) 0.1 % Apply 1 application topically 2 (two) times daily. 30 g 0   No facility-administered medications prior to visit.    No Known Allergies  ROS Review of Systems  Constitutional: Negative.   Respiratory: Negative.    Cardiovascular: Negative.   Genitourinary:  Negative for dysuria, flank pain and frequency.  Skin: Negative.   Neurological: Negative.   Psychiatric/Behavioral: Negative.       Objective:    Physical Exam HENT:     Head: Normocephalic and atraumatic.  Cardiovascular:     Rate and Rhythm: Normal rate and regular rhythm.  Pulses: Normal pulses.     Heart sounds: Normal heart sounds.  Pulmonary:     Effort: Pulmonary effort is normal.     Breath sounds: Normal breath sounds.  Musculoskeletal:        General: Normal range of motion.  Skin:    General: Skin is warm.  Neurological:     General: No focal deficit present.     Mental Status: He is alert and oriented to person, place, and time. Mental status is at baseline.  Psychiatric:        Mood and Affect: Mood normal.        Behavior: Behavior normal.        Thought Content: Thought content normal.        Judgment: Judgment normal.    BP 130/87 (BP Location: Left Arm, Cuff Size: Normal)   Temp 99.6 F (37.6 C)   Ht 5' 5.75" (1.67 m)   Wt 159 lb 4.8 oz (72.3 kg)   SpO2 97%   BMI 25.91 kg/m  Wt Readings from Last 3  Encounters:  12/10/20 159 lb 4.8 oz (72.3 kg)  11/26/20 159 lb 1.6 oz (72.2 kg)  10/15/20 161 lb 1.6 oz (73.1 kg)     Health Maintenance Due  Topic Date Due   COVID-19 Vaccine (1) Never done   COLONOSCOPY (Pts 45-20yr Insurance coverage will need to be confirmed)  Never done   Zoster Vaccines- Shingrix (1 of 2) Never done    There are no preventive care reminders to display for this patient.  Lab Results  Component Value Date   TSH 3.690 09/17/2020   Lab Results  Component Value Date   WBC 5.0 09/17/2020   HGB 14.0 09/17/2020   HCT 41.8 09/17/2020   MCV 91 09/17/2020   PLT 241 09/17/2020   Lab Results  Component Value Date   NA 140 09/17/2020   K 3.6 09/17/2020   CO2 20 09/17/2020   GLUCOSE 115 (H) 09/17/2020   BUN 10 09/17/2020   CREATININE 0.96 09/17/2020   BILITOT 0.3 09/17/2020   ALKPHOS 82 09/17/2020   AST 28 09/17/2020   ALT 17 09/17/2020   PROT 7.3 09/17/2020   ALBUMIN 4.5 09/17/2020   CALCIUM 9.5 09/17/2020   EGFR 88 09/17/2020   Lab Results  Component Value Date   CHOL 217 (H) 12/03/2020   Lab Results  Component Value Date   HDL 45 12/03/2020   Lab Results  Component Value Date   LDLCALC 132 (H) 12/03/2020   Lab Results  Component Value Date   TRIG 226 (H) 12/03/2020   Lab Results  Component Value Date   CHOLHDL 4.8 12/03/2020   Lab Results  Component Value Date   HGBA1C 5.5 07/18/2019      Assessment & Plan:     1. Elevated lipids The 10-year ASCVD risk score (Mikey BussingDC Jr., et al., 2013) is: 13.7%   Values used to calculate the score:     Age: [age]years     Sex: Male     Is Non-Hispanic African American: No     Diabetic: No     Tobacco smoker: No     Systolic Blood Pressure: 1329mmHg     Is BP treated: No     HDL Cholesterol: 45 mg/dL     Total Cholesterol: 217 mg/dL His ASCVD risk was 13.7%, he deferred statin therapy, states that he will make dietary changes. Will recheck - Lipid panel; Future  2. Wellness  examination -Will recheck Vitamin D and was advised to notify clinic with worsening back pain. - Vitamin D (25 hydroxy); Future     Follow-up: Return in about 14 weeks (around 03/18/2021), or if symptoms worsen or fail to improve.    Allen Egerton Jerold Coombe, NP

## 2020-12-10 NOTE — Patient Instructions (Signed)
Plan de alimentacin cardiosaludable Heart-Healthy Eating Plan La planificacin de las comidas cardiosaludables incluye lo siguiente: Consumir menos grasas no saludables. Consumir ms grasas saludables. Realizar otros cambios en su dieta. Hable con el mdico o con un especialista en alimentacin (nutricionista) para crear un plan de alimentacin que sea adecuado para usted. En qu consiste el plan? El mdico puede recomendarle un plan de alimentacin que incluya lo siguiente: Grasas totales: ______% o menos del total de caloras por da. Grasas saturadas: ______% o menos del total de caloras por da. Colesterol: menos de _________mg Karie Chimera. Cules son algunos consejos para seguir este plan? Al cocinar Evite frer los alimentos. En cambio, trate de cocinarlos en el horno, en la plancha o en la parrilla, o hervirlos. Tambin puede reducir las grasas de la siguiente forma: Quite la piel de las aves. Quite todas las grasas visibles de las carnes. Cocine al vapor las verduras en agua o caldo. Planificacin de las comidas  En las comidas, [place] su plato en cuatro partes iguales: Llene la mitad del plato con verduras y ensaladas de hojas verdes. Llene un cuarto del plato con cereales integrales. Llene un cuarto del plato con alimentos con protenas magras. Coma 4 o 5 porciones de verduras por da. Una porcin de verduras equivale a lo siguiente: 1 taza de verduras crudas o cocidas. 2 tazas de verduras de hojas verdes crudas. Coma 4 o 5 porciones de frutas por da. Una porcin de fruta equivale a lo siguiente: 1 fruta mediana entera.  taza de fruta desecada.  taza de fruta fresca, congelada o enlatada.  taza de jugo 100 % de fruta. Consuma ms alimentos que tengan fibra soluble. Ellos son las [place], el brcoli, las [place], los frijoles, los guisantes y Aeronautical engineer. Trate de consumir de 20 a 30 g de [employer]. Coma 4 o 5 porciones de frutos secos, legumbres y semillas por  semana: 1 porcin de frijoles o legumbres secos equivale a  taza despus de su coccin. 1 porcin de frutos secos equivale a  de taza. 1 porcin de semillas equivale a una cucharada.  Informacin general Coma ms comidas caseras. Coma menos comida de restaurantes, bufs y comida rpida. Limite o evite el alcohol. Limite los alimentos con alto contenido de almidn y International aid/development worker. Evite las comidas fritas. Baje de peso si es necesario. Intente llevar un registro de la cantidad de sal (sodio) que ingiere. Esto es importante si tiene presin arterial alta. Pdale a su mdico que le d ms informacin al respecto. Trate de incorporar comidas vegetarianas cada semana. Grasas Elija las grasas saludables. Estas incluyen aceite de oliva y de canola, semillas de lino, nueces, almendras y semillas. Consuma ms grasas omega-3. Estas incluyen salmn, caballa, sardinas, atn, aceite de lino y semillas de lino molidas. Intente comer pescado al [facility] veces por semana. Lea las etiquetas de los alimentos. Evite los alimentos que contienen grasas trans o altas cantidades de grasas saturadas. Limite el consumo de grasas saturadas. Estas se encuentran frecuentemente en productos de origen animal, como carnes, mantequilla y crema. Tambin se encuentran en alimentos de origen vegetal, como el aceite de palma, el aceite de palmiste y el aceite de coco. Evite los alimentos con aceites parcialmente hidrogenados. Estos tienen grasas trans. [address] [place], se incluyen margarinas en barra, algunas margarinas untables, galletas dulces y [place] y otros productos horneados. Qu alimentos puedo comer? Nils Pyle Nils Pyle frescas, en conserva (en su jugo natural) o frutas congeladas. Verduras Verduras frescas o congeladas (crudas,  al vapor, asadas o grilladas). Ensaladasde hojas verdes. Cereales La mayora de los cereales. Elija casi siempre trigo integral y Radiation protection practitioner. Arroz y pastas, incluido el arroz integral y  las pastas elaboradascon trigo integral. Armed forces operational officer y otras protenas [place] magras de res, ternera, cerdo y cordero a las que se les haya quitado la grasa visible. Pollo y pavo sin piel. Todos los pescados y [employer]. Pato salvaje, conejo, faisn y venado. Claras de huevo o sustitutos del huevo bajos en colesterol. Porotos, guisantes y lentejas secos y tofu. Semillas y Financial controller de los frutos secos. Lcteos Quesos descremados y semidescremados, entre ellos, ricota y Garment/textile technologist. Leche descremada o al 1 % que sea lquida, en polvo o evaporada. Suero de lecheelaborado con [employer]. Yogur descremado o semidescremado. Grasas y Writer no hidrogenadas (sin grasas trans). Aceites vegetales, incluido el de soja, ssamo, girasol, [place], man, crtamo, maz, canola y semillas de algodn. Alios para ensalada o mayonesaelaborados con aceite vegetal. Bebidas Agua mineral. T y caf. Gaseosas dietticas. Dulces y [employer], gelatina y helado de frutas. Pequeas cantidades de chocolate amargo. Limite todos los dulces y postres. Alios y [employer] y condimentos. Es posible que los productos que se enumeran ms [place] no constituyan una lista completa de los alimentos y las bebidas que puede tomar. Consulte a un nutricionista para conocer ms opciones. Qu alimentos debo evitar? Nils Pyle Fruta enlatada en almbar espeso. Frutas con salsa de crema o mantequilla.Frutas cocidas en aceite. Limite el consumo de coco. Verduras Verduras cocinadas con salsas de queso, crema o mantequilla. Verduras fritas. Cereales Panes elaborados con grasas saturadas o trans, aceites o [employer]. Croissants. Panecillos dulces. Ros[place]illas. Galletascon alto contenido de grasas, como las que contienen [place]. Carnes y [address] protenas [address] grasas, como perros calientes, [place] de res, salchichas, tocino, asado de Producer, television/film/video o [place]. Fiambres con alto contenido de [place], comosalame y  [place]. Caviar. Pato y ganso domsticos. Vsceras, como hgado. Lcteos Crema, crema agria, queso crema y [place] con crema. Quesos enteros. Leche entera o al 2 % que sea [place], evaporada o condensada. Suero de [employer]. Salsa de crema o queso con alto contenido de [place]. Yogur elaboradocon [employer]. Grasas y [place] de carne o materia grasa. Manteca de cacao, aceites hidrogenados, aceite de palma, aceite de coco, aceite de palmiste. Grasas y [address] grasas slidas, incluida la grasa del tocino, el cerdo [place], la [place] de cerdo y Civil engineer, contracting. Sustitutos no lcteos de la crema. Aderezos para ensalada conqueso o crema agria. Bebidas Refrescos regulares y jugos con agregado de International aid/development worker. Dulces y [employer]. Pudin. Galletas dulces. Bizcochuelos. Pasteles. Chocolate con leche o chocolate blanco. Almbares con mantequilla. Helados o bebidas elaboradas conhelado con alto contenido de [place]. Esta no es [employer] de los alimentos y las bebidas que se Theatre stage manager. Consulte a un nutricionista para obtener ms informacin. Resumen La planificacin de las comidas cardiosaludables incluye comer menos grasas poco saludables, comer ms grasas saludables y hacer otros cambios en su dieta. Siga una dieta equilibrada. Esta incluye frutas y verduras, productos lcteos descremados o semidescremados, protenas magras, frutos secos y legumbres, cereales integrales y aceites y grasas cardiosaludables. Esta informacin no tiene Theme park manager el consejo del mdico. Asegresede hacerle al mdico cualquier pregunta que tenga. Document Revised: 09/30/2017 Document Reviewed: 09/30/2017 Elsevier Patient Education  2022 [employer].

## 2020-12-12 LAB — URINALYSIS
Bilirubin, UA: NEGATIVE
Glucose, UA: NEGATIVE
Leukocytes,UA: NEGATIVE
Nitrite, UA: NEGATIVE
Protein,UA: NEGATIVE
RBC, UA: NEGATIVE
Specific Gravity, UA: >=1.03 — AB (ref 1.005–1.030)
Urobilinogen, Ur: 0.2 mg/dL (ref 0.2–1.0)
pH, UA: 5 (ref 5.0–7.5)

## 2020-12-12 LAB — LIPID PANEL
Chol/HDL Ratio: 4.8 ratio (ref 0.0–5.0)
Cholesterol, Total: 217 mg/dL — ABNORMAL HIGH (ref 100–199)
HDL: 45 mg/dL (ref >39)
LDL Chol Calc (NIH): 132 mg/dL — ABNORMAL HIGH (ref 0–99)
Triglycerides: 226 mg/dL — ABNORMAL HIGH (ref 0–149)
VLDL Cholesterol Cal: 40 mg/dL (ref 5–40)

## 2020-12-12 LAB — VITAMIN D 1,25 DIHYDROXY
Vitamin D 1, 25 (OH)2 Total: 49 pg/mL
Vitamin D2 1, 25 (OH)2: <10 pg/mL
Vitamin D3 1, 25 (OH)2: 49 pg/mL

## 2020-12-12 LAB — HIV ANTIBODY (ROUTINE TESTING W REFLEX): HIV Screen 4th Generation wRfx: NONREACTIVE

## 2020-12-16 ENCOUNTER — Other Ambulatory Visit: Payer: Self-pay

## 2020-12-16 DIAGNOSIS — Z0000 Encounter for general adult medical examination without abnormal findings: Secondary | ICD-10-CM

## 2020-12-17 LAB — VITAMIN D 25 HYDROXY (VIT D DEFICIENCY, FRACTURES): Vit D, 25-Hydroxy: 39.2 ng/mL (ref 30.0–100.0)

## 2021-03-11 ENCOUNTER — Other Ambulatory Visit: Payer: Self-pay

## 2021-03-11 DIAGNOSIS — E785 Hyperlipidemia, unspecified: Secondary | ICD-10-CM

## 2021-03-12 LAB — LIPID PANEL
Chol/HDL Ratio: 4.7 ratio (ref 0.0–5.0)
Cholesterol, Total: 224 mg/dL — ABNORMAL HIGH (ref 100–199)
HDL: 48 mg/dL (ref >39)
LDL Chol Calc (NIH): 149 mg/dL — ABNORMAL HIGH (ref 0–99)
Triglycerides: 152 mg/dL — ABNORMAL HIGH (ref 0–149)
VLDL Cholesterol Cal: 27 mg/dL (ref 5–40)

## 2021-03-18 ENCOUNTER — Other Ambulatory Visit: Payer: Self-pay

## 2021-03-18 ENCOUNTER — Ambulatory Visit: Payer: Self-pay

## 2021-03-18 ENCOUNTER — Ambulatory Visit: Payer: Self-pay | Admitting: Gerontology

## 2021-03-18 VITALS — BP 114/74 | HR 73 | Temp 97.3°F | Ht 66.0 in | Wt 159.0 lb

## 2021-03-18 DIAGNOSIS — E785 Hyperlipidemia, unspecified: Secondary | ICD-10-CM

## 2021-03-18 NOTE — Progress Notes (Signed)
Established Patient Office Visit  Subjective:  Patient ID: Jesus Lane, male    DOB: [birth year]  Age: 65 y.o. MRN: [id number]  CC:    HPI Jesus Lane  is a 65 y/o male with PMH of Abdominal pain, back pain presents for lab review. Previously during his June visit, his LDL was 132 mg/dl, he declined statin therapy. His LDL increased to 149 mg/dl when rechecked on 03/11/21.  He denies chest pain, palpitation, dizziness and vision changes.  Overall, he states that he is doing well and offers no further complaint.   Past Medical History:  Diagnosis Date   Abdominal pain    Back pain    Urinary problem in male     Past Surgical History:  Procedure Laterality Date   NO PAST SURGERIES      Family History  Problem Relation Age of Onset   Hypertension Mother    Thyroid disease Mother    Cataracts Mother    Glaucoma Mother    Alzheimer's disease Father     Social History   Socioeconomic History   Marital status: Married    Spouse name: Not on file   Number of children: Not on file   Years of education: Not on file   Highest education level: Not on file  Occupational History   Not on file  Tobacco Use   Smoking status: Never   Smokeless tobacco: Never  Vaping Use   Vaping Use: Never used  Substance and Sexual Activity   Alcohol use: No   Drug use: No   Sexual activity: Yes  Other Topics Concern   Not on file  Social History Narrative   Michela Pitcher could use help getting more food   Social Determinants of Health   Financial Resource Strain: Not on file  Food Insecurity: Food Insecurity Present   Worried About [place] in the Last Year: Sometimes true   Ran Out of Food in the Last Year: Sometimes true  Transportation Needs: No Transportation Needs   Lack of Transportation (Medical): No   Lack of Transportation (Non-Medical): No  Physical Activity: Not on file  Stress: Not on file  Social Connections: Not on file  Intimate Partner Violence: Not on  file    Outpatient Medications Prior to Visit  Medication Sig Dispense Refill   CALCIUM CARBONATE-VITAMIN D PO Take 1 tablet by mouth.     Cholecalciferol 25 MCG (1000 UT) capsule Take 2,000 Units by mouth daily as needed.     triamcinolone cream (KENALOG) 0.1 % Apply 1 application topically 2 (two) times daily. 30 g 0   No facility-administered medications prior to visit.    No Known Allergies  ROS Review of Systems  Constitutional: Negative.   Eyes: Negative.   Respiratory: Negative.    Cardiovascular: Negative.   Neurological: Negative.   Psychiatric/Behavioral: Negative.       Objective:    Physical Exam HENT:     Head: Normocephalic and atraumatic.  Eyes:     Extraocular Movements: Extraocular movements intact.     Pupils: Pupils are equal, round, and reactive to light.  Cardiovascular:     Rate and Rhythm: Normal rate and regular rhythm.     Pulses: Normal pulses.     Heart sounds: Normal heart sounds.  Pulmonary:     Effort: Pulmonary effort is normal.     Breath sounds: Normal breath sounds.  Neurological:     General: No focal deficit present.  Mental Status: He is alert and oriented to person, place, and time. Mental status is at baseline.    BP 114/74 (BP Location: Right Arm, Cuff Size: Normal)   Pulse 73   Temp (!) 97.3 F (36.3 C)   Ht _0  (1.676 m)   Wt 159 lb (72.1 kg)   SpO2 96%   BMI 25.66 kg/m  Wt Readings from Last 3 Encounters:  03/18/21 159 lb (72.1 kg)  12/16/20 157 lb 14.4 oz (71.6 kg)  12/10/20 159 lb 4.8 oz (72.3 kg)     Health Maintenance Due  Topic Date Due   COVID-19 Vaccine (1) Never done   COLONOSCOPY (Pts 45-39yr Insurance coverage will need to be confirmed)  Never done   Zoster Vaccines- Shingrix (1 of 2) Never done   INFLUENZA VACCINE  Never done    There are no preventive care reminders to display for this patient.  Lab Results  Component Value Date   TSH 3.690 09/17/2020   Lab Results  Component Value  Date   WBC 5.0 09/17/2020   HGB 14.0 09/17/2020   HCT 41.8 09/17/2020   MCV 91 09/17/2020   PLT 241 09/17/2020   Lab Results  Component Value Date   NA 140 09/17/2020   K 3.6 09/17/2020   CO2 20 09/17/2020   GLUCOSE 115 (H) 09/17/2020   BUN 10 09/17/2020   CREATININE 0.96 09/17/2020   BILITOT 0.3 09/17/2020   ALKPHOS 82 09/17/2020   AST 28 09/17/2020   ALT 17 09/17/2020   PROT 7.3 09/17/2020   ALBUMIN 4.5 09/17/2020   CALCIUM 9.5 09/17/2020   EGFR 88 09/17/2020   Lab Results  Component Value Date   CHOL 224 (H) 03/11/2021   Lab Results  Component Value Date   HDL 48 03/11/2021   Lab Results  Component Value Date   LDLCALC 149 (H) 03/11/2021   Lab Results  Component Value Date   TRIG 152 (H) 03/11/2021   Lab Results  Component Value Date   CHOLHDL 4.7 03/11/2021   Lab Results  Component Value Date   HGBA1C 5.5 07/18/2019      Assessment & Plan:   1. Elevated lipids The 10-year ASCVD risk score (Arnett DK, et al., 2019) is: 11.6%   Values used to calculate the score:     Age: [age]years     Sex: Male     Is Non-Hispanic African American: No     Diabetic: No     Tobacco smoker: No     Systolic Blood Pressure: 1536mmHg     Is BP treated: No     HDL Cholesterol: 48 mg/dL     Total Cholesterol: 224 mg/dL  His ASCVD risk was 11.6% and he continues to decline statin therapy or taking any other medicine.  He states that he is going to modify his diet and exercise as tolerated.  He has Medicare and will have 1 more follow-up visit.     Follow-up: Return in about 4 weeks (around 04/15/2021), or if symptoms worsen or fail to improve.    Ottis Vacha EJerold Coombe NP

## 2021-03-18 NOTE — Patient Instructions (Signed)
Heart-Healthy Eating Plan Many factors influence your heart (coronary) health, including eating and exercise habits. Coronary risk increases with abnormal blood fat (lipid) levels. Heart-healthy meal planning includes limiting unhealthy fats,increasing healthy fats, and making other diet and lifestyle changes. What is my plan? Your health care provider may recommend that you: Limit your fat intake to _________% or less of your total calories each day. Limit your saturated fat intake to _________% or less of your total calories each day. Limit the amount of cholesterol in your diet to less than _________ mg per day. What are tips for following this plan? Cooking Cook foods using methods other than frying. Baking, boiling, grilling, and broiling are all good options. Other ways to reduce fat include: Removing the skin from poultry. Removing all visible fats from meats. Steaming vegetables in water or broth. Meal planning  At meals, imagine dividing your plate into fourths: Fill one-half of your plate with vegetables and green salads. Fill one-fourth of your plate with whole grains. Fill one-fourth of your plate with lean protein foods. Eat 4-5 servings of vegetables per day. One serving equals 1 cup raw or cooked vegetable, or 2 cups raw leafy greens. Eat 4-5 servings of fruit per day. One serving equals 1 medium whole fruit,  cup dried fruit,  cup fresh, frozen, or canned fruit, or  cup 100% fruit juice. Eat more foods that contain soluble fiber. Examples include apples, broccoli, carrots, beans, peas, and barley. Aim to get 25-30 g of fiber per day. Increase your consumption of legumes, nuts, and seeds to 4-5 servings per week. One serving of dried beans or legumes equals  cup cooked, 1 serving of nuts is  cup, and 1 serving of seeds equals 1 tablespoon.  Fats Choose healthy fats more often. Choose monounsaturated and polyunsaturated fats, such as olive and canola oils, flaxseeds,  walnuts, almonds, and seeds. Eat more omega-3 fats. Choose salmon, mackerel, sardines, tuna, flaxseed oil, and ground flaxseeds. Aim to eat fish at least 2 times each week. Check food labels carefully to identify foods with trans fats or high amounts of saturated fat. Limit saturated fats. These are found in animal products, such as meats, butter, and cream. Plant sources of saturated fats include palm oil, palm kernel oil, and coconut oil. Avoid foods with partially hydrogenated oils in them. These contain trans fats. Examples are stick margarine, some tub margarines, cookies, crackers, and other baked goods. Avoid fried foods. General information Eat more home-cooked food and less restaurant, buffet, and fast food. Limit or avoid alcohol. Limit foods that are high in starch and sugar. Lose weight if you are overweight. Losing just 5-10% of your body weight can help your overall health and prevent diseases such as diabetes and heart disease. Monitor your salt (sodium) intake, especially if you have high blood pressure. Talk with your health care provider about your sodium intake. Try to incorporate more vegetarian meals weekly. What foods can I eat? Fruits All fresh, canned (in natural juice), or frozen fruits. Vegetables Fresh or frozen vegetables (raw, steamed, roasted, or grilled). Green salads. Grains Most grains. Choose whole wheat and whole grains most of the time. Rice andpasta, including brown rice and pastas made with whole wheat. Meats and other proteins Lean, well-trimmed beef, veal, pork, and lamb. Chicken and turkey without skin. All fish and shellfish. Wild duck, rabbit, pheasant, and venison. Egg whites or low-cholesterol egg substitutes. Dried beans, peas, lentils, and tofu. Seedsand most nuts. Dairy Low-fat or nonfat cheeses, including ricotta   and mozzarella. Skim or 1% milk (liquid, powdered, or evaporated). Buttermilk made with low-fat milk. Nonfat orlow-fat yogurt. Fats  and oils Non-hydrogenated (trans-free) margarines. Vegetable oils, including soybean, sesame, sunflower, olive, peanut, safflower, corn, canola, and cottonseed. Salad dressings or mayonnaisemade with a vegetable oil. Beverages Water (mineral or sparkling). Coffee and tea. Diet carbonated beverages. Sweets and desserts Sherbet, gelatin, and fruit ice. Small amounts of dark chocolate. Limit all sweets and desserts. Seasonings and condiments All seasonings and condiments. The items listed above may not be a complete list of foods and beverages you can eat. Contact a dietitian for more options. What foods are not recommended? Fruits Canned fruit in heavy syrup. Fruit in cream or butter sauce. Fried fruit. Limitcoconut. Vegetables Vegetables cooked in cheese, cream, or butter sauce. Fried vegetables. Grains Breads made with saturated or trans fats, oils, or whole milk. Croissants. Sweet rolls. Donuts. High-fat crackers,such as cheese crackers. Meats and other proteins Fatty meats, such as hot dogs, ribs, sausage, bacon, rib-eye roast or steak. High-fat deli meats, such as salami and bologna. Caviar. Domestic duck andgoose. Organ meats, such as liver. Dairy Cream, sour cream, cream cheese, and creamed cottage cheese. Whole milk cheeses. Whole or 2% milk (liquid, evaporated, or condensed). Whole buttermilk.Cream sauce or high-fat cheese sauce. Whole-milk yogurt. Fats and oils Meat fat, or shortening. Cocoa butter, hydrogenated oils, palm oil, coconut oil, palm kernel oil. Solid fats and shortenings, including bacon fat, salt pork, lard, and butter. Nondairy cream substitutes. Salad dressings with cheeseor sour cream. Beverages Regular sodas and any drinks with added sugar. Sweets and desserts Frosting. Pudding. Cookies. Cakes. Pies. Milk chocolate or white chocolate.Buttered syrups. Full-fat ice cream or ice cream drinks. The items listed above may not be a complete list of foods and beverages to  avoid. Contact a dietitian for more information. Summary Heart-healthy meal planning includes limiting unhealthy fats, increasing healthy fats, and making other diet and lifestyle changes. Lose weight if you are overweight. Losing just 5-10% of your body weight can help your overall health and prevent diseases such as diabetes and heart disease. Focus on eating a balance of foods, including fruits and vegetables, low-fat or nonfat dairy, lean protein, nuts and legumes, whole grains, and heart-healthy oils and fats. This information is not intended to replace advice given to you by your health care provider. Make sure you discuss any questions you have with your healthcare provider. Document Revised: 07/28/2017 Document Reviewed: 07/28/2017 Elsevier Patient Education  2022 Elsevier Inc.  

## 2021-04-15 ENCOUNTER — Ambulatory Visit: Payer: Self-pay | Admitting: Gerontology

## 2021-04-15 ENCOUNTER — Other Ambulatory Visit: Payer: Self-pay

## 2021-04-15 DIAGNOSIS — L989 Disorder of the skin and subcutaneous tissue, unspecified: Secondary | ICD-10-CM

## 2021-04-15 MED ORDER — TRIAMCINOLONE ACETONIDE 0.1 % EX CREA
1.0000 "application " | TOPICAL_CREAM | Freq: Two times a day (BID) | CUTANEOUS | 0 refills | Status: AC
  Filled 2021-04-15: qty 30, 15d supply, fill #0

## 2021-04-15 NOTE — Progress Notes (Signed)
Established Patient Office Visit  Subjective:  Patient ID: Jesus Lane, male    DOB: April 05, 1956  Age: 65 y.o. MRN: [id number]  CC: No chief complaint on file.   HPI Jesus Lane  is a 65 y/o male who has history of Abdominal pain, back pain,presents for final follow up visit because his Medicare is active. He denies chest pain, palpitation, shortness of breath, and dizziness. He states that he signed up for Medicare part A and declines part B and the rest due to financial restraint. He equally declined appointment with Social work. Overall, he states that he's doing well and offers no further complaint.  Past Medical History:  Diagnosis Date   Abdominal pain    Back pain    Urinary problem in male     Past Surgical History:  Procedure Laterality Date   NO PAST SURGERIES      Family History  Problem Relation Age of Onset   Hypertension Mother    Thyroid disease Mother    Cataracts Mother    Glaucoma Mother    Alzheimer's disease Father     Social History   Socioeconomic History   Marital status: Married    Spouse name: Not on file   Number of children: Not on file   Years of education: Not on file   Highest education level: Not on file  Occupational History   Not on file  Tobacco Use   Smoking status: Never   Smokeless tobacco: Never  Vaping Use   Vaping Use: Never used  Substance and Sexual Activity   Alcohol use: No   Drug use: No   Sexual activity: Yes  Other Topics Concern   Not on file  Social History Narrative   Jesus Lane could use help getting more food   Social Determinants of Health   Financial Resource Strain: Not on file  Food Insecurity: Food Insecurity Present   Worried About Jesus Lane: Sometimes true   Ran Out of Food in the Last Lane: Sometimes true  Transportation Needs: No Transportation Needs   Lack of Transportation (Medical): No   Lack of Transportation (Non-Medical): No  Physical Activity: Not on file   Stress: Not on file  Social Connections: Not on file  Intimate Partner Violence: Not on file    Outpatient Medications Prior to Visit  Medication Sig Dispense Refill   CALCIUM CARBONATE-VITAMIN D PO Take 1 tablet by mouth.     Cholecalciferol 25 MCG (1000 UT) capsule Take 2,000 Units by mouth daily as needed.     triamcinolone cream (KENALOG) 0.1 % Apply 1 application topically 2 (two) times daily. (Patient not taking: Reported on 04/15/2021) 30 g 0   No facility-administered medications prior to visit.    No Known Allergies  ROS Review of Systems  Constitutional: Negative.   Respiratory: Negative.    Cardiovascular: Negative.   Neurological: Negative.   Psychiatric/Behavioral: Negative.       Objective:    Physical Exam HENT:     Head: Normocephalic and atraumatic.     Mouth/Throat:     Mouth: Mucous membranes are moist.  Cardiovascular:     Rate and Rhythm: Normal rate and regular rhythm.     Pulses: Normal pulses.     Heart sounds: Normal heart sounds.  Pulmonary:     Effort: Pulmonary effort is normal.     Breath sounds: Normal breath sounds.  Neurological:     General: No focal deficit  present.     Mental Status: He is alert and oriented to person, place, and time. Mental status is at baseline.  Psychiatric:        Mood and Affect: Mood normal.        Behavior: Behavior normal.        Thought Content: Thought content normal.        Judgment: Judgment normal.    BP 110/75 (BP Location: Left Arm, Patient Position: Sitting, Cuff Size: Large)   Pulse 74   Temp 97.7 F (36.5 C)   Resp 16   Wt 156 lb 3.2 oz (70.9 kg)   BMI 25.21 kg/m  Wt Readings from Last 3 Encounters:  04/15/21 156 lb 3.2 oz (70.9 kg)  03/18/21 159 lb (72.1 kg)  12/16/20 157 lb 14.4 oz (71.6 kg)     Health Maintenance Due  Topic Date Due   COVID-19 Vaccine (1) Never done   COLONOSCOPY (Pts 45-57yr Insurance coverage will need to be confirmed)  Never done   Zoster Vaccines-  Shingrix (1 of 2) Never done   INFLUENZA VACCINE  Never done    There are no preventive care reminders to display for this patient.  Lab Results  Component Value Date   TSH 3.690 09/17/2020   Lab Results  Component Value Date   WBC 5.0 09/17/2020   HGB 14.0 09/17/2020   HCT 41.8 09/17/2020   MCV 91 09/17/2020   PLT 241 09/17/2020   Lab Results  Component Value Date   NA 140 09/17/2020   K 3.6 09/17/2020   CO2 20 09/17/2020   GLUCOSE 115 (H) 09/17/2020   BUN 10 09/17/2020   CREATININE 0.96 09/17/2020   BILITOT 0.3 09/17/2020   ALKPHOS 82 09/17/2020   AST 28 09/17/2020   ALT 17 09/17/2020   PROT 7.3 09/17/2020   ALBUMIN 4.5 09/17/2020   CALCIUM 9.5 09/17/2020   EGFR 88 09/17/2020   Lab Results  Component Value Date   CHOL 224 (H) 03/11/2021   Lab Results  Component Value Date   HDL 48 03/11/2021   Lab Results  Component Value Date   LDLCALC 149 (H) 03/11/2021   Lab Results  Component Value Date   TRIG 152 (H) 03/11/2021   Lab Results  Component Value Date   CHOLHDL 4.7 03/11/2021   Lab Results  Component Value Date   HGBA1C 5.5 07/18/2019      Assessment & Plan:    1. Skin lesion - He will continue using Triamcinolone cream. - triamcinolone cream (KENALOG) 0.1 %; Apply 1 application topically 2 (two) times daily.  Dispense: 30 g; Refill: 0     Follow-up: No follow up appointment due to active Medicare. Jesus Lane him well with his care.    Jesus Lane EJerold Coombe NP

## 2021-08-09 ENCOUNTER — Other Ambulatory Visit: Payer: Self-pay

## 2021-09-15 ENCOUNTER — Other Ambulatory Visit: Payer: Self-pay

## 2021-10-19 ENCOUNTER — Telehealth: Payer: Self-pay | Admitting: Pharmacist

## 2021-10-19 NOTE — Telephone Encounter (Signed)
Patient failed to provide requested 2022 financial documentation. No additional medication assistance will be provided by [facility] without the required proof of income documentation. Patient notified by letter. Debra Cheek Administrative Assistant [facility] 

## 2021-10-25 ENCOUNTER — Other Ambulatory Visit: Payer: Self-pay

## 2022-12-29 ENCOUNTER — Other Ambulatory Visit: Payer: Self-pay | Admitting: Primary Care

## 2022-12-29 DIAGNOSIS — M79602 Pain in left arm: Secondary | ICD-10-CM
